# Patient Record
Sex: Female | Born: 1970 | Race: Black or African American | Hispanic: No | Marital: Married | Smoking: Never smoker
Health system: Southern US, Community
[De-identification: ages and names within clinical notes are randomized; demographics above are authoritative.]

## PROBLEM LIST (undated history)

## (undated) DIAGNOSIS — K6282 Dysplasia of anus: Secondary | ICD-10-CM

## (undated) DIAGNOSIS — IMO0002 Reserved for concepts with insufficient information to code with codable children: Secondary | ICD-10-CM

## (undated) DIAGNOSIS — O1490 Unspecified pre-eclampsia, unspecified trimester: Secondary | ICD-10-CM

## (undated) DIAGNOSIS — Z87442 Personal history of urinary calculi: Secondary | ICD-10-CM

## (undated) DIAGNOSIS — I10 Essential (primary) hypertension: Secondary | ICD-10-CM

## (undated) DIAGNOSIS — M0800 Unspecified juvenile rheumatoid arthritis of unspecified site: Secondary | ICD-10-CM

## (undated) DIAGNOSIS — N200 Calculus of kidney: Secondary | ICD-10-CM

## (undated) DIAGNOSIS — M329 Systemic lupus erythematosus, unspecified: Secondary | ICD-10-CM

## (undated) DIAGNOSIS — Z973 Presence of spectacles and contact lenses: Secondary | ICD-10-CM

## (undated) DIAGNOSIS — Z5189 Encounter for other specified aftercare: Secondary | ICD-10-CM

## (undated) HISTORY — PX: TUBAL LIGATION: SHX77

## (undated) HISTORY — DX: Systemic lupus erythematosus, unspecified: M32.9

## (undated) HISTORY — DX: Reserved for concepts with insufficient information to code with codable children: IMO0002

## (undated) HISTORY — DX: Unspecified juvenile rheumatoid arthritis of unspecified site: M08.00

## (undated) HISTORY — DX: Unspecified pre-eclampsia, unspecified trimester: O14.90

## (undated) HISTORY — DX: Calculus of kidney: N20.0

## (undated) HISTORY — DX: Encounter for other specified aftercare: Z51.89

---

## 2006-01-19 ENCOUNTER — Ambulatory Visit: Payer: Self-pay | Admitting: Family Medicine

## 2006-03-07 DIAGNOSIS — E669 Obesity, unspecified: Secondary | ICD-10-CM | POA: Insufficient documentation

## 2006-03-07 DIAGNOSIS — M329 Systemic lupus erythematosus, unspecified: Secondary | ICD-10-CM

## 2006-04-27 ENCOUNTER — Encounter: Payer: Self-pay | Admitting: Family Medicine

## 2006-07-26 ENCOUNTER — Encounter: Payer: Self-pay | Admitting: Family Medicine

## 2007-02-27 ENCOUNTER — Other Ambulatory Visit: Admission: RE | Admit: 2007-02-27 | Discharge: 2007-02-27 | Payer: Self-pay | Admitting: Family Medicine

## 2007-02-27 ENCOUNTER — Ambulatory Visit: Payer: Self-pay | Admitting: Family Medicine

## 2007-02-27 ENCOUNTER — Encounter: Payer: Self-pay | Admitting: Family Medicine

## 2007-02-27 DIAGNOSIS — L2089 Other atopic dermatitis: Secondary | ICD-10-CM | POA: Insufficient documentation

## 2007-03-05 ENCOUNTER — Telehealth (INDEPENDENT_AMBULATORY_CARE_PROVIDER_SITE_OTHER): Payer: Self-pay | Admitting: *Deleted

## 2007-03-07 ENCOUNTER — Ambulatory Visit: Payer: Self-pay | Admitting: Family Medicine

## 2007-03-08 ENCOUNTER — Encounter: Payer: Self-pay | Admitting: Family Medicine

## 2007-03-08 ENCOUNTER — Telehealth (INDEPENDENT_AMBULATORY_CARE_PROVIDER_SITE_OTHER): Payer: Self-pay | Admitting: *Deleted

## 2007-03-08 LAB — CONVERTED CEMR LAB
Clue Cells Wet Prep HPF POC: NONE SEEN
Trich, Wet Prep: NONE SEEN
Yeast Wet Prep HPF POC: NONE SEEN

## 2007-05-03 ENCOUNTER — Ambulatory Visit: Payer: Self-pay | Admitting: Family Medicine

## 2007-06-18 ENCOUNTER — Ambulatory Visit: Payer: Self-pay | Admitting: Family Medicine

## 2008-02-25 ENCOUNTER — Telehealth: Payer: Self-pay | Admitting: Family Medicine

## 2008-02-25 ENCOUNTER — Ambulatory Visit: Payer: Self-pay | Admitting: Family Medicine

## 2008-02-25 LAB — CONVERTED CEMR LAB: Beta hcg, urine, semiquantitative: POSITIVE

## 2008-03-12 ENCOUNTER — Encounter: Payer: Self-pay | Admitting: Family Medicine

## 2008-03-12 LAB — CONVERTED CEMR LAB
ALT: 18 units/L
Alkaline Phosphatase: 45 units/L
CO2: 27 meq/L
Creatinine, Ser: 0.7 mg/dL
Sodium: 138 meq/L
Total Bilirubin: 0.6 mg/dL
Total Protein: 6.6 g/dL

## 2008-03-14 ENCOUNTER — Encounter: Payer: Self-pay | Admitting: Family Medicine

## 2008-03-24 ENCOUNTER — Encounter: Payer: Self-pay | Admitting: Family Medicine

## 2008-05-02 ENCOUNTER — Ambulatory Visit: Payer: Self-pay | Admitting: Family Medicine

## 2008-05-30 DIAGNOSIS — Z8759 Personal history of other complications of pregnancy, childbirth and the puerperium: Secondary | ICD-10-CM

## 2008-05-30 HISTORY — PX: TUBAL LIGATION: SHX77

## 2008-05-30 HISTORY — DX: Personal history of other complications of pregnancy, childbirth and the puerperium: Z87.59

## 2008-08-05 ENCOUNTER — Encounter: Payer: Self-pay | Admitting: Family Medicine

## 2008-08-13 ENCOUNTER — Encounter: Payer: Self-pay | Admitting: Family Medicine

## 2008-09-05 ENCOUNTER — Encounter: Payer: Self-pay | Admitting: Family Medicine

## 2008-12-15 ENCOUNTER — Ambulatory Visit: Payer: Self-pay | Admitting: Family Medicine

## 2008-12-15 DIAGNOSIS — I10 Essential (primary) hypertension: Secondary | ICD-10-CM | POA: Insufficient documentation

## 2008-12-16 ENCOUNTER — Telehealth (INDEPENDENT_AMBULATORY_CARE_PROVIDER_SITE_OTHER): Payer: Self-pay | Admitting: *Deleted

## 2008-12-18 ENCOUNTER — Encounter: Payer: Self-pay | Admitting: Family Medicine

## 2009-01-06 ENCOUNTER — Telehealth: Payer: Self-pay | Admitting: Family Medicine

## 2009-01-06 DIAGNOSIS — R809 Proteinuria, unspecified: Secondary | ICD-10-CM | POA: Insufficient documentation

## 2009-01-28 ENCOUNTER — Encounter: Payer: Self-pay | Admitting: Family Medicine

## 2009-06-23 ENCOUNTER — Ambulatory Visit: Payer: Self-pay | Admitting: Family Medicine

## 2009-06-23 DIAGNOSIS — R599 Enlarged lymph nodes, unspecified: Secondary | ICD-10-CM | POA: Insufficient documentation

## 2009-06-24 ENCOUNTER — Encounter: Payer: Self-pay | Admitting: Family Medicine

## 2009-06-25 LAB — CONVERTED CEMR LAB
Basophils Relative: 1 % (ref 0–1)
Eosinophils Absolute: 0.1 10*3/uL (ref 0.0–0.7)
Eosinophils Relative: 1 % (ref 0–5)
Glucose, Bld: 83 mg/dL (ref 70–99)
HCT: 44.3 % (ref 36.0–46.0)
HDL: 41 mg/dL (ref >39)
LDL Cholesterol: 87 mg/dL (ref 0–99)
Lymphs Abs: 1.6 10*3/uL (ref 0.7–4.0)
MCHC: 32.1 g/dL (ref 30.0–36.0)
MCV: 82.2 fL (ref 78.0–100.0)
Monocytes Relative: 10 % (ref 3–12)
Neutrophils Relative %: 60 % (ref 43–77)
Platelets: 214 10*3/uL (ref 150–400)
Total CHOL/HDL Ratio: 3.4
WBC: 5.5 10*3/uL (ref 4.0–10.5)

## 2010-02-18 ENCOUNTER — Encounter: Payer: Self-pay | Admitting: Family Medicine

## 2010-03-19 ENCOUNTER — Ambulatory Visit: Payer: Self-pay | Admitting: Family Medicine

## 2010-03-22 LAB — CONVERTED CEMR LAB
Calcium: 8.9 mg/dL (ref 8.4–10.5)
Creatinine, Ser: 0.76 mg/dL (ref 0.40–1.20)
Sodium: 142 meq/L (ref 135–145)

## 2010-04-16 ENCOUNTER — Ambulatory Visit: Payer: Self-pay | Admitting: Family Medicine

## 2010-04-16 DIAGNOSIS — J069 Acute upper respiratory infection, unspecified: Secondary | ICD-10-CM | POA: Insufficient documentation

## 2010-06-29 NOTE — Assessment & Plan Note (Signed)
Summary: f/u BP   Vital Signs:  Patient profile:   40 year old female Menstrual status:  regular Height:      64 inches Weight:      178 pounds Pulse rate:   80 / minute BP sitting:   135 / 84  (left arm) Cuff size:   regular  Vitals Entered By: Kathlene November LPN (March 19, 2010 9:01 AM) CC: follow-up BP, Hypertension Management   CC:  follow-up BP and Hypertension Management.  Hypertension History:      She denies headache, chest pain, palpitations, dyspnea with exertion, orthopnea, peripheral edema, visual symptoms, and side effects from treatment.  She notes no problems with any antihypertensive medication side effects.  Some BPs are running higher, even on Enalapril 10s.  She had NO PROTEINURIA on last check with rheumatologist.        Positive major cardiovascular risk factors include hypertension.  Negative major cardiovascular risk factors include female age less than 31 years old, no history of diabetes or hyperlipidemia, negative family history for ischemic heart disease, and non-tobacco-user status.        Further assessment for target organ damage reveals no history of ASHD, cardiac end-organ damage (CHF/LVH), stroke/TIA, peripheral vascular disease, renal insufficiency, or hypertensive retinopathy.     Current Medications (verified): 1)  Aspirin 81 Mg Tbec (Aspirin) .... Take 1 Tablet By Mouth Once A Day 2)  Oystercal 500 + D 500-125 Mg-Unit Tabs (Calcium Carbonate-Vitamin D) .... Two Times A Day By Mouth 3)  Hydroxychloroquine Sulfate 200 Mg Tabs (Hydroxychloroquine Sulfate) .... Two Times A Day By Mouth 4)  Multivitamins  Tabs (Multiple Vitamin) .... Take 1 Tablet By Mouth Once A Day 5)  Prednisone 5 Mg Tabs (Prednisone) .... Take 1 Tablet By Mouth Every Other Day 6)  Enalapril Maleate 10 Mg Tabs (Enalapril Maleate) .Marland Kitchen.. 1 Tab By Mouth Daily  Allergies (verified): 1)  ! Sulfa  Comments:  Nurse/Medical Assistant: The patient's medications and allergies were  reviewed with the patient and were updated in the Medication and Allergy Lists. Kathlene November LPN (March 19, 2010 9:01 AM)  Social History: Reviewed history from 06/23/2009 and no changes required. Married to [place] an adopted son and an 66 month old son.  Planning to adopt her 1 yr old neice..    Had 1 abortion.  Works as a Charity fundraiser.  Clinical research associate.  Denies drugs or ETOH.  Does aerobics 3 x a wk, trying to lose wt.  Review of Systems      See HPI  Physical Exam  General:  alert, well-developed, well-nourished, and well-hydrated.  obese Head:  normocephalic and atraumatic.   Eyes:  pupils equal, pupils round, and pupils reactive to light.   Mouth:  pharynx pink and moist.   Neck:  no masses.   Lungs:  Normal respiratory effort, chest expands symmetrically. Lungs are clear to auscultation, no crackles or wheezes. Heart:  Normal rate and regular rhythm. S1 and S2 normal without gallop, murmur, click, rub or other extra sounds. Pulses:  2+ radial pulses Extremities:  no E/C/C Skin:  color normal.   Cervical Nodes:  No lymphadenopathy noted Psych:  good eye contact, not anxious appearing, and not depressed appearing.     Impression & Recommendations:  Problem # 1:  ESSENTIAL HYPERTENSION, BENIGN (ICD-401.1) BP OK today but has had several high readings at home.  I will increase her dose from 10 to 20 mg once daily today.  Update a BMP today.  Will send  a copy to rheum and nephrology. Her updated medication list for this problem includes:    Enalapril Maleate 20 Mg Tabs (Enalapril maleate) .Marland Kitchen... 1 tab by mouth daily  Orders: T-Basic Metabolic Panel [id number])  BP today: 135/84 Prior BP: 132/90 (06/23/2009)  10 Yr Risk Heart Disease: 1 %  Labs Reviewed: K+: 4.1 (03/12/2008) Creat: : 0.7 (03/12/2008)   Chol: 141 (06/24/2009)   HDL: 41 (06/24/2009)   LDL: 87 (06/24/2009)   TG: 63 (06/24/2009)  Problem # 2:  PROTEINURIA (ICD-791.0) Her last UA was neg for protein at her  nephrology office with new start of Enalapril.  This is progess. She is due for f/u with Dr Westley Hummer in Nov.  Reminded her to schedule this.  SLE meds have not changed.  Problem # 3:  SLE (ICD-710.0) Per rheum. Her updated medication list for this problem includes:    Aspirin 81 Mg Tbec (Aspirin) .Marland Kitchen... Take 1 tablet by mouth once a day    Hydroxychloroquine Sulfate 200 Mg Tabs (Hydroxychloroquine sulfate) .Marland Kitchen..Marland Kitchen Two times a day by mouth    Prednisone 5 Mg Tabs (Prednisone) .Marland Kitchen... Take 1 tablet by mouth every other day  Complete Medication List: 1)  Aspirin 81 Mg Tbec (Aspirin) .... Take 1 tablet by mouth once a day 2)  Oystercal 500 + D 500-125 Mg-unit Tabs (Calcium carbonate-vitamin d) .... Two times a day by mouth 3)  Hydroxychloroquine Sulfate 200 Mg Tabs (Hydroxychloroquine sulfate) .... Two times a day by mouth 4)  Multivitamins Tabs (Multiple vitamin) .... Take 1 tablet by mouth once a day 5)  Prednisone 5 Mg Tabs (Prednisone) .... Take 1 tablet by mouth every other day 6)  Enalapril Maleate 20 Mg Tabs (Enalapril maleate) .Marland Kitchen.. 1 tab by mouth daily  Hypertension Assessment/Plan:      The patient's hypertensive risk group is category A: No risk factors and no target organ damage.  Her calculated 10 year risk of coronary heart disease is 1 %.  Today's blood pressure is 135/84.  Her blood pressure goal is < 140/90.  Patient Instructions: 1)  Go up on Enalapril to 20 mg once daily for BP. 2)  Update BMP downstairs today. 3)  I will send a copy to rheumatologist and Dr Westley Hummer. 4)  Schedule f/u with Dr Westley Hummer in [facility]. 5)  Will call you w/ lab results Monday. 6)  Schedule f/u in 4 mos. Prescriptions: ENALAPRIL MALEATE 20 MG TABS (ENALAPRIL MALEATE) 1 tab by mouth daily  #90 x 1   Entered and Authorized by:   Seymour Bars DO   Signed by:   Seymour Bars DO on 03/19/2010   Method used:   Electronically to        [facility] [phone]* (retail)       [address] [place], [place]  [address]       Ph: [phone]       Fax: [phone]   RxID:   [id number]    Orders Added: 1)  T-Basic Metabolic Panel [id number] 2)  Est. Patient Level III [54627]

## 2010-06-29 NOTE — Letter (Signed)
Summary: [facility]   [facility]   Imported By: Lanelle Bal 03/01/2010 10:38:06  _____________________________________________________________________  External Attachment:    Type:   Image     Comment:   External Document

## 2010-06-29 NOTE — Assessment & Plan Note (Signed)
Summary: CPE w/o pap   Vital Signs:  Patient profile:   40 year old female Menstrual status:  regular LMP:     06/01/2009 Height:      64 inches Weight:      184 pounds BMI:     31.70 O2 Sat:      99 % on Room air Temp:     98.6 degrees F oral Pulse rate:   77 / minute BP sitting:   132 / 90  (left arm) Cuff size:   large  Vitals Entered By: Payton Spark CMA (June 23, 2009 1:46 PM)  O2 Flow:  Room air CC: CPE w/ pap LMP (date): 06/01/2009     Menstrual Status regular Enter LMP: 06/01/2009   Primary Care Provider:  Seymour Bars DO  CC:  CPE w/ pap.  History of Present Illness: 40 yo AAF presents for CPE with pap smear.  She is s/p BTL  She has an adopted 70 yr old son and an 63 month old son born via NSVD  at [place].  She went back for her postpartum check so her pap smear is UTD.    She had a high risk pregancy but her SLE did not flare up.  She was on bedrest for pre-clampsia.  She delivered at 37 wks.    She is due for cholesterol testing.  Seeing Dr Marcha Solders in [place] for SLE and Dr Westley Hummer in [facility] for nephrology.    Allergies: 1)  ! Sulfa  Past History:  Past Medical History: Reviewed history from 12/15/2008 and no changes required. 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 father healthy  mother CVA x 2, first one in 7`s; HTN  only child  Paternal Aunt SLE  Social History: Reviewed history from 12/15/2008 and no changes required. Married to [place] an adopted son and an 43 month old son.  Planning to adopt her 1 yr old neice..    Had 1 abortion.  Works as a Charity fundraiser.  Clinical research associate.  Denies drugs or ETOH.  Does aerobics 3 x a wk, trying to lose wt.  Review of Systems  The patient denies anorexia, fever, weight loss, weight gain, vision loss, decreased hearing,  hoarseness, chest pain, syncope, dyspnea on exertion, peripheral edema, prolonged cough, headaches, hemoptysis, abdominal pain, melena, hematochezia, severe indigestion/heartburn, hematuria, incontinence, genital sores, muscle weakness, suspicious skin lesions, transient blindness, difficulty walking, depression, unusual weight change, abnormal bleeding, enlarged lymph nodes, angioedema, breast masses, and testicular masses.    Physical Exam  General:  alert, well-developed, well-nourished, well-hydrated, and overweight-appearing.   Head:  normocephalic and atraumatic.   Eyes:  pupils equal, pupils round, and pupils reactive to light.   Ears:  no external deformities.   Nose:  no nasal discharge.   Mouth:  good dentition and pharynx pink and moist.   Neck:  hard pea - sized R posterior cervical chain lymph node, non tender Breasts:  No mass, nodules, thickening, tenderness, bulging, retraction, inflamation, nipple discharge or skin changes noted.   Lungs:  Normal respiratory effort, chest expands symmetrically. Lungs are clear to auscultation, no crackles or wheezes. Heart:  Normal rate and regular rhythm. S1 and S2 normal without gallop, murmur, click, rub or other extra sounds. Abdomen:  Bowel sounds positive,abdomen soft and non-tender without masses, organomegaly or hernias  noted. Msk:  no joint swelling, no joint warmth, and no redness over joints.   Pulses:  2+ radial and pedal pulses Extremities:  no E/C/C Skin:  color normal.   Cervical Nodes:  No lymphadenopathy noted Psych:  good eye contact, not anxious appearing, and not depressed appearing.     Impression & Recommendations:  Problem # 1:  Preventive Health Care (ICD-V70.0) Keeping healthy checklist for women reviewed. BP at the [facility] -- go up on Enalapril from 5--> 10 mg once daily. Has f/u with Dr Iran Ouch for [facility] and f/u SLE. Update fasting cholesterol, glucose  and CBC. Tetanus UTD. Postpartum pap done 7 mos ago. Work on  [employer], exercise. DEXA done 2010 for chronic steroid use.    Problem # 2:  CERVICAL LYMPHADENOPATHY, ANTERIOR, RIGHT (ICD-785.6) Treat with Keflex x 7 days and check a CBC today. REcheck node in 2-3 wks. If persisting, will send to gen surg for a biopsy (it has been there 1-2 mos already). The following medications were removed from the medication list:    Amoxicillin 875 Mg Tabs (Amoxicillin) .Marland Kitchen... 1 tab by mouth q 12 hrs x 10 days Her updated medication list for this problem includes:    Keflex 500 Mg Caps (Cephalexin) .Marland Kitchen... 1 capsule by mouth three times a day x 7 days  Orders: T-CBC w/Diff ([id number])  Complete Medication List: 1)  Aspirin 81 Mg Tbec (Aspirin) .... Take 1 tablet by mouth once a day 2)  Oystercal 500 + D 500-125 Mg-unit Tabs (Calcium carbonate-vitamin d) .... Two times a day by mouth 3)  Hydroxychloroquine Sulfate 200 Mg Tabs (Hydroxychloroquine sulfate) .... Two times a day by mouth 4)  Multivitamins Tabs (Multiple vitamin) .... Take 1 tablet by mouth once a day 5)  Prednisone 5 Mg Tabs (Prednisone) .... Take 1 tablet by mouth every other day 6)  Enalapril Maleate 10 Mg Tabs (Enalapril maleate) .Marland Kitchen.. 1 tab by mouth daily 7)  Keflex 500 Mg Caps (Cephalexin) .Marland Kitchen.. 1 capsule by mouth three times a day x 7 days  Other Orders: T-Lipid Profile ([id number]) T-Glucose, Blood ([id number])  Patient Instructions: 1)  Have fasting labs drawn. 2)  Take 7 days of Keflex. 3)  REcheck R sided neck lymph node in 2-3 wks. Prescriptions: KEFLEX 500 MG CAPS (CEPHALEXIN) 1 capsule by mouth three times a day x 7 days  #21 x 0   Entered and Authorized by:   Seymour Bars DO   Signed by:   Seymour Bars DO on 06/23/2009   Method used:   Electronically to        [facility] [phone]* (retail)       [address] [place], [place]  [address]       Ph: [phone]       Fax: [phone]   RxID:   [id number] ENALAPRIL MALEATE 10 MG TABS (ENALAPRIL MALEATE) 1 tab by  mouth daily  #90 x 1   Entered and Authorized by:   Seymour Bars DO   Signed by:   Seymour Bars DO on 06/23/2009   Method used:   Electronically to        [facility] [phone]* (retail)       [address] [place], [place]  [address]       Ph: [phone]       Fax: [phone]   RxID:   [id number]

## 2010-06-29 NOTE — Assessment & Plan Note (Signed)
Summary: URI,HTN   Vital Signs:  Patient profile:   40 year old female Menstrual status:  regular Height:      64 inches Weight:      178 pounds Temp:     98.2 degrees F oral Pulse rate:   90 / minute BP sitting:   149 / 88  (right arm) Cuff size:   regular  Vitals Entered By: Avon Gully CMA, Duncan Dull) (April 16, 2010 9:15 AM) CC: sinus pressure since Mon, teeth hurt last pm   Primary Care Provider:  Seymour Bars DO  CC:  sinus pressure since Mon and teeth hurt last pm.  History of Present Illness: sinus pressure since Mon, teeth hurt last pm.  Started about 5 days.  Started wiht a ST, runny nose, cough.  + sick contacts with kids at home.  Sinus pressure started yesterday.  No fever.  Drinking tea and homey.  Cough drops. NO GI sxs.  no ear pain.    Current Medications (verified): 1)  Aspirin 81 Mg Tbec (Aspirin) .... Take 1 Tablet By Mouth Once A Day 2)  Oystercal 500 + D 500-125 Mg-Unit Tabs (Calcium Carbonate-Vitamin D) .... Two Times A Day By Mouth 3)  Hydroxychloroquine Sulfate 200 Mg Tabs (Hydroxychloroquine Sulfate) .... Two Times A Day By Mouth 4)  Multivitamins  Tabs (Multiple Vitamin) .... Take 1 Tablet By Mouth Once A Day 5)  Prednisone 5 Mg Tabs (Prednisone) .... Take 1 Tablet By Mouth Every Other Day 6)  Enalapril Maleate 20 Mg Tabs (Enalapril Maleate) .Marland Kitchen.. 1 Tab By Mouth Daily  Allergies (verified): 1)  ! Sulfa  Comments:  Nurse/Medical Assistant: The patient's medications and allergies were reviewed with the patient and were updated in the Medication and Allergy Lists. Avon Gully CMA, Duncan Dull) (April 16, 2010 9:17 AM)  Physical Exam  General:  Well-developed,well-nourished,in no acute distress; alert,appropriate and cooperative throughout examination Head:  Normocephalic and atraumatic without obvious abnormalities. No apparent alopecia or balding. No facial pressure or tenderness.  Eyes:  No corneal or conjunctival inflammation noted. EOMI.  Perrla. Ears:  External ear exam shows no significant lesions or deformities.  Otoscopic examination reveals clear canals, tympanic membranes are intact bilaterally without bulging, retraction, inflammation or discharge. Hearing is grossly normal bilaterally. Nose:  External nasal examination shows no deformity or inflammation.  Mouth:  Oral mucosa and oropharynx without lesions or exudates.  Teeth in good repair. Neck:  No deformities, masses, or tenderness noted. Lungs:  Normal respiratory effort, chest expands symmetrically. Lungs are clear to auscultation, no crackles or wheezes. Heart:  Normal rate and regular rhythm. S1 and S2 normal without gallop, murmur, click, rub or other extra sounds. Pulses:  Radial 2+  Skin:  no rashes.   Cervical Nodes:  No lymphadenopathy noted Psych:  Cognition and judgment appear intact. Alert and cooperative with normal attention span and concentration. No apparent delusions, illusions, hallucinations   Impression & Recommendations:  Problem # 1:  URI (ICD-465.9) She is on steroids for SLE so dicussed that if she suddenly feels worse or not getting better or suddently spikes a temp needs to call us.  Her updated medication list for this problem includes:    Aspirin 81 Mg Tbec (Aspirin) .Marland Kitchen... Take 1 tablet by mouth once a day  Instructed on symptomatic treatment. Call if symptoms persist or worsen.   Problem # 2:  ESSENTIAL HYPERTENSION, BENIGN (ICD-401.1) Not well controlled. Change to Losartan and f/u withDr. B in one month.  Her updated medication  list for this problem includes:    Losartan Potassium 100 Mg Tabs (Losartan potassium) .Marland Kitchen... Take 1 tablet by mouth once a day  Complete Medication List: 1)  Aspirin 81 Mg Tbec (Aspirin) .... Take 1 tablet by mouth once a day 2)  Oystercal 500 + D 500-125 Mg-unit Tabs (Calcium carbonate-vitamin d) .... Two times a day by mouth 3)  Hydroxychloroquine Sulfate 200 Mg Tabs (Hydroxychloroquine sulfate) .... Two  times a day by mouth 4)  Multivitamins Tabs (Multiple vitamin) .... Take 1 tablet by mouth once a day 5)  Prednisone 5 Mg Tabs (Prednisone) .... Take 1 tablet by mouth every other day 6)  Losartan Potassium 100 Mg Tabs (Losartan potassium) .... Take 1 tablet by mouth once a day  Patient Instructions: 1)  Call if you get worse, or if not better in one week.  Prescriptions: LOSARTAN POTASSIUM 100 MG TABS (LOSARTAN POTASSIUM) Take 1 tablet by mouth once a day  #30 x 1   Entered and Authorized by:   Nani Gasser MD   Signed by:   Nani Gasser MD on 04/16/2010   Method used:   Electronically to        [facility] [phone]* (retail)       [address] [place], [place]  [address]       Ph: [phone]       Fax: [phone]   RxID:   [id number]    Orders Added: 1)  Est. Patient Level III [25956]

## 2010-07-20 ENCOUNTER — Encounter: Payer: Self-pay | Admitting: Family Medicine

## 2010-07-20 ENCOUNTER — Encounter (INDEPENDENT_AMBULATORY_CARE_PROVIDER_SITE_OTHER): Payer: Managed Care, Other (non HMO) | Admitting: Family Medicine

## 2010-07-20 ENCOUNTER — Other Ambulatory Visit (HOSPITAL_COMMUNITY)
Admission: RE | Admit: 2010-07-20 | Discharge: 2010-07-20 | Disposition: A | Discharge status: Home / Self Care | Payer: Managed Care, Other (non HMO) | Source: Ambulatory Visit | Attending: Family Medicine | Admitting: Family Medicine

## 2010-07-20 ENCOUNTER — Other Ambulatory Visit: Payer: Self-pay | Admitting: Family Medicine

## 2010-07-20 DIAGNOSIS — Z01419 Encounter for gynecological examination (general) (routine) without abnormal findings: Secondary | ICD-10-CM

## 2010-07-20 DIAGNOSIS — Z23 Encounter for immunization: Secondary | ICD-10-CM

## 2010-07-22 ENCOUNTER — Encounter: Payer: Self-pay | Admitting: Family Medicine

## 2010-07-23 ENCOUNTER — Encounter: Payer: Self-pay | Admitting: Family Medicine

## 2010-07-27 LAB — CONVERTED CEMR LAB
Albumin: 3.6 g/dL (ref 3.5–5.2)
Alkaline Phosphatase: 64 units/L (ref 39–117)
BUN: 11 mg/dL (ref 6–23)
CO2: 23 meq/L (ref 19–32)
Cholesterol: 141 mg/dL (ref 0–200)
Glucose, Bld: 84 mg/dL (ref 70–99)
HDL: 40 mg/dL (ref >39)
Hemoglobin: 13.7 g/dL (ref 12.0–15.0)
MCHC: 33.1 g/dL (ref 30.0–36.0)
MCV: 82.1 fL (ref 78.0–100.0)
Potassium: 4.1 meq/L (ref 3.5–5.3)
RBC: 5.04 M/uL (ref 3.87–5.11)
Sodium: 136 meq/L (ref 135–145)
Total Bilirubin: 0.5 mg/dL (ref 0.3–1.2)
Total Protein: 7 g/dL (ref 6.0–8.3)
Triglycerides: 79 mg/dL (ref <150)
WBC: 8.2 10*3/uL (ref 4.0–10.5)

## 2010-07-27 NOTE — Assessment & Plan Note (Signed)
Summary: Cpe W/ PAP   Vital Signs:  Patient profile:   40 year old female Menstrual status:  regular LMP:     06/17/2010 Height:      64 inches Weight:      178 pounds BMI:     30.66 O2 Sat:      98 % on Room air Pulse rate:   85 / minute BP sitting:   138 / 91  (left arm) Cuff size:   large  Vitals Entered By: Payton Spark CMA (July 20, 2010 9:11 AM)  O2 Flow:  Room air CC: CPE w/ pap LMP (date): 06/17/2010     Enter LMP: 06/17/2010   Primary Care Provider:  Seymour Bars DO  CC:  CPE w/ pap.  History of Present Illness: 40 yo AAF presents for CPE with pap smear.  She has 3 kids, 2 are adopted.  She is married, monogamous and had a BTL for contraception.  Periods are regular.  SLE stable on current meds, follows with Dr Marcha Solders.  She is due for Tdap.  Seeing eye doctor annually, on Plaquenil and has had a DEXA recently for hx of chronic prednisone use.    Denies fam hx of breast or colon cancer or of premature heart dz.      Current Medications (verified): 1)  Aspirin 81 Mg Tbec (Aspirin) .... Take 1 Tablet By Mouth Once A Day 2)  Oystercal 500 + D 500-125 Mg-Unit Tabs (Calcium Carbonate-Vitamin D) .... Two Times A Day By Mouth 3)  Hydroxychloroquine Sulfate 200 Mg Tabs (Hydroxychloroquine Sulfate) .... Two Times A Day By Mouth 4)  Multivitamins  Tabs (Multiple Vitamin) .... Take 1 Tablet By Mouth Once A Day 5)  Prednisone 5 Mg Tabs (Prednisone) .... Take 1 Tablet By Mouth Every Other Day 6)  Losartan Potassium 100 Mg Tabs (Losartan Potassium) .... Take 1 Tablet By Mouth Once A Day  Allergies (verified): 1)  ! Sulfa  Past History:  Past Medical History: Reviewed history from 12/15/2008 and no changes required. chronic prednisone use  fasting glucose 68 8-07  hs JRA since age 54 SLE-- Dr Marcha Solders in [facility] G2P1- preeclampsia, delivery at 37 wks  Past Surgical History: BTL  Family History: Reviewed history from 04/27/2006 and no changes  required. father healthy  mother CVA x 2, first one in 56`s; HTN  only child  Paternal Aunt SLE  Social History: Reviewed history from 06/23/2009 and no changes required. Married to Sigurd an adopted son and an 66 month old son.  Planning to adopt her 1 yr old neice..    Had 1 abortion.  Works as a Charity fundraiser.  Clinical research associate.  Denies drugs or ETOH.  Does aerobics 3 x a wk, trying to lose wt.  Review of Systems  The patient denies anorexia, fever, weight loss, weight gain, vision loss, decreased hearing, hoarseness, chest pain, syncope, dyspnea on exertion, peripheral edema, prolonged cough, headaches, hemoptysis, abdominal pain, melena, hematochezia, severe indigestion/heartburn, hematuria, incontinence, genital sores, muscle weakness, suspicious skin lesions, transient blindness, difficulty walking, depression, unusual weight change, abnormal bleeding, enlarged lymph nodes, angioedema, breast masses, and testicular masses.    Physical Exam  General:  alert, well-developed, well-nourished, well-hydrated, and overweight-appearing.   Head:  normocephalic and atraumatic.   Eyes:  conjunctiva clear Ears:  EACs patent; TMs translucent and gray with good cone of light and bony landmarks.  Nose:  no nasal discharge.   Mouth:  good dentition and pharynx pink and moist.  Neck:  no masses.   Breasts:  No mass, nodules, thickening, tenderness, bulging, retraction, inflamation, nipple discharge or skin changes noted.   Lungs:  Normal respiratory effort, chest expands symmetrically. Lungs are clear to auscultation, no crackles or wheezes. Heart:  Normal rate and regular rhythm. S1 and S2 normal without gallop, murmur, click, rub or other extra sounds. Abdomen:  Bowel sounds positive,abdomen soft and non-tender without masses, organomegaly Genitalia:  Pelvic Exam:        External: normal female genitalia without lesions or masses        Vagina: normal without lesions or masses, white discharge with  cervical redness        Cervix: normal without lesions or masses        Adnexa: normal bimanual exam without masses or fullness        Uterus: normal by palpation        Pap smear: performed Msk:  no joint swelling, no joint warmth, and no redness over joints.   Pulses:  2+ radial and pedal pulses Extremities:  no LE edema Skin:  color normal and no suspicious lesions.   Cervical Nodes:  No lymphadenopathy noted Psych:  good eye contact, not anxious appearing, and not depressed appearing.     Impression & Recommendations:  Problem # 1:  Gynecological examination-routine (ICD-V72.31) Keeping healthy checklist for women reviewed. BP high -- will add Norvasc to Losartan for improved control and recheck in 4 mos. Update fastting labs. Thin prep pap smear done today; repeat in 2 yrs if normal. BMI 30.6 c/w class I obesity.  Counseled on healthy diet , regular exercise, wt loss. Tdap updated today. BTL for birth control.  Complete Medication List: 1)  Aspirin 81 Mg Tbec (Aspirin) .... Take 1 tablet by mouth once a day 2)  Oystercal 500 + D 500-125 Mg-unit Tabs (Calcium carbonate-vitamin d) .... Two times a day by mouth 3)  Hydroxychloroquine Sulfate 200 Mg Tabs (Hydroxychloroquine sulfate) .... Two times a day by mouth 4)  Multivitamins Tabs (Multiple vitamin) .... Take 1 tablet by mouth once a day 5)  Prednisone 5 Mg Tabs (Prednisone) .... Take 1 tablet by mouth every other day 6)  Losartan Potassium 100 Mg Tabs (Losartan potassium) .... Take 1 tablet by mouth once a day 7)  Norvasc 5 Mg Tabs (Amlodipine besylate) .Marland Kitchen.. 1 tab by mouth once daily  Other Orders: Tdap => 77yrs IM (16109) Admin 1st Vaccine (60454) T-CBC No Diff ([id number]) T-Comprehensive Metabolic Panel [phone]) T-Lipid Profile ([id number])  Patient Instructions: 1)  Will call you with pap results Fri or Monday. 2)  Stay on Losartan daily. 3)  Add Norvasc 5 mg once daily for high BP. 4)  Update fasting  labs. 5)  Will call you w/ results. 6)  Return for f/u BP in 4 mos. Prescriptions: LOSARTAN POTASSIUM 100 MG TABS (LOSARTAN POTASSIUM) Take 1 tablet by mouth once a day  #30 x 5   Entered and Authorized by:   Seymour Bars DO   Signed by:   Seymour Bars DO on 07/20/2010   Method used:   Electronically to        [facility] [phone]* (retail)       [address] [place], [place]  [address]       Ph: [phone]       Fax: [phone]   RxID:   [id number] NORVASC 5 MG TABS (AMLODIPINE BESYLATE) 1 tab by  mouth once daily  #30 x 5   Entered and Authorized by:   Seymour Bars DO   Signed by:   Seymour Bars DO on 07/20/2010   Method used:   Electronically to        [facility] [phone]* (retail)       [address] [place], [place]  [address]       Ph: [phone]       Fax: [phone]   RxID:   [id number]    Orders Added: 1)  Tdap => 18yrs IM [90715] 2)  Admin 1st Vaccine [90471] 3)  T-CBC No Diff [[id number]] 4)  T-Comprehensive Metabolic Panel [[id number]] 5)  T-Lipid Profile [[id number]] 6)  Est. Patient age 32-39 [21]   Immunizations Administered:  Tetanus Vaccine:    Vaccine Type: Tdap    Site: left deltoid    Dose: 0.5 ml    Route: IM    Given by: Payton Spark CMA    Exp. Date: 03/18/2012    Lot #: [id number]    VIS given: 04/16/08 version given July 20, 2010.   Immunizations Administered:  Tetanus Vaccine:    Vaccine Type: Tdap    Site: left deltoid    Dose: 0.5 ml    Route: IM    Given by: Payton Spark CMA    Exp. Date: 03/18/2012    Lot #: [id number]    VIS given: 04/16/08 version given July 20, 2010.

## 2010-11-07 ENCOUNTER — Encounter: Payer: Self-pay | Admitting: Family Medicine

## 2010-11-12 ENCOUNTER — Encounter: Payer: Self-pay | Admitting: Family Medicine

## 2010-11-12 ENCOUNTER — Ambulatory Visit (INDEPENDENT_AMBULATORY_CARE_PROVIDER_SITE_OTHER): Payer: Managed Care, Other (non HMO) | Admitting: Family Medicine

## 2010-11-12 DIAGNOSIS — I10 Essential (primary) hypertension: Secondary | ICD-10-CM

## 2010-11-12 DIAGNOSIS — M329 Systemic lupus erythematosus, unspecified: Secondary | ICD-10-CM

## 2010-11-12 NOTE — Patient Instructions (Signed)
Stay on current meds.  Monitor resting BPs at home.  Work on [employer], regular exercise.  REturn for f/u in 6 mos.

## 2010-11-12 NOTE — Assessment & Plan Note (Signed)
BP elevated today but has been at goal elsewhere.  Did not change her meds today.  She has RFs and her labs were done in Feb of this year.  She had her urine checked with Dr Marcha Solders and apparently the ARB has helped to clear her proteinuria.  Advised purchase of home BP meter, info given on this w/ a log book.  Call if any problems.  Work on diet, exercise, wt loss.  F/u in 6 mos.

## 2010-11-12 NOTE — Progress Notes (Signed)
  Subjective:    Patient ID: Anna Patel, female    DOB: 15-Jun-1970, 40 y.o.   MRN: [id number]  HPI  40 yo AAF presents for f/u HTN.  She is doing well.  She is on Norvasc + Losartan.  Her BPs outside of here have been running 130s/ 70s and she denies edema, chest pain, SOB, heart palpitations or SEs from the meds.  She is s/p BTL.  Working on wt loss.  No changes in lupus meds, followed by Dr Marcha Solders in [place].  BP 139/96  Pulse 86  Ht 5\' 4"  (1.626 m)  Wt 182 lb (82.555 kg)  BMI 31.24 kg/m2  SpO2 99%  LMP 10/15/2010    Review of Systems  Constitutional: Positive for fatigue. Negative for fever and unexpected weight change.  Eyes: Negative for visual disturbance.  Respiratory: Negative for shortness of breath.   Cardiovascular: Negative for chest pain, palpitations and leg swelling.  Musculoskeletal: Negative for joint swelling and arthralgias.  Neurological: Negative for headaches.  Psychiatric/Behavioral: Negative for dysphoric mood.       Objective:   Physical Exam  Constitutional: She appears well-developed and well-nourished. No distress.       obese  Eyes: Pupils are equal, round, and reactive to light. No scleral icterus.  Neck: Neck supple.  Cardiovascular: Normal rate, regular rhythm and normal heart sounds.   No murmur heard. Pulmonary/Chest: Effort normal and breath sounds normal. No respiratory distress. She has no wheezes.  Musculoskeletal: She exhibits no edema.  Lymphadenopathy:    She has no cervical adenopathy.  Skin: Skin is warm and dry.  Psychiatric: She has a normal mood and affect.          Assessment & Plan:

## 2010-11-19 ENCOUNTER — Telehealth: Payer: Self-pay | Admitting: Family Medicine

## 2010-11-19 NOTE — Telephone Encounter (Signed)
Pt called and on vacation and apparently had a reaction to Neutrogena sunblock.  Affected arms and legs.  Pt has been using topical cream but would like a recommendation of what is best to use.  Please advise. Plan:  Routed to Dr Linford Arnold since Dr. Cathey Endow out of the office. Isidore Moos

## 2010-11-19 NOTE — Telephone Encounter (Signed)
Recommend topical hydrocortisone cream bid and benadryl 25mg  every 6 hours prn for itching and rash.

## 2010-11-23 NOTE — Telephone Encounter (Signed)
Pt informed to use hydrocortisone cream BID and benadryle 25 mg Po every 6 hours prn itching. Jarvis Newcomer, LPN Domingo Dimes

## 2011-05-12 ENCOUNTER — Encounter: Payer: Self-pay | Admitting: Emergency Medicine

## 2011-05-12 ENCOUNTER — Emergency Department
Admission: EM | Admit: 2011-05-12 | Discharge: 2011-05-12 | Disposition: A | Discharge status: Home / Self Care | Payer: Managed Care, Other (non HMO) | Source: Home / Self Care | Attending: Family Medicine | Admitting: Family Medicine

## 2011-05-12 DIAGNOSIS — J209 Acute bronchitis, unspecified: Secondary | ICD-10-CM

## 2011-05-12 MED ORDER — CLARITHROMYCIN 500 MG PO TABS: 500.0000 mg | ORAL_TABLET | Freq: Two times a day (BID) | ORAL | Status: AC

## 2011-05-12 MED ORDER — BENZONATATE 200 MG PO CAPS: 200.0000 mg | ORAL_CAPSULE | Freq: Every day | ORAL | Status: AC

## 2011-05-12 NOTE — ED Provider Notes (Signed)
History     CSN: [id number] Arrival date & time: 05/12/2011  3:53 PM   First MD Initiated Contact with Patient 05/12/11 1600      Chief Complaint  Patient presents with  . Cough      HPI Comments: Patient complains of approximately two month history of persistent cough.  It started as a mild cold  with gradually progressive URI symptoms beginning with a mild sore throat (now improved), followed by progressive nasal congestion and then a cough.  The sinus congestion resolved after two weeks but she now has persistent cough which is worse at night and generally non-productive during the day.  There has been no pleuritic pain, shortness of breath, or wheezes.  She often coughs until she gags.  She believes that she had a tetanus shot 4 years ago.  No recent fevers, chills, and sweats   Patient is a 40 y.o. female presenting with cough. The history is provided by the patient.  Cough    Past Medical History  Diagnosis Date  . Chronic steroid use   . JRA (juvenile rheumatoid arthritis)   . SLE ([place] encephalitis)   . Preeclampsia     delivery 37 weeks    Past Surgical History  Procedure Date  . Btl     Family History  Problem Relation Age of Onset  . Heart disease Mother     CVA X 2 , 40's  . Hypertension Mother   . Lupus Paternal Aunt     History  Substance Use Topics  . Smoking status: Never Smoker   . Smokeless tobacco: Not on file  . Alcohol Use: No    OB History    Grav Para Term Preterm Abortions TAB SAB Ect Mult Living                  Review of Systems  Respiratory: Positive for cough.   No sore throat. + cough No pleuritic pain No wheezing No nasal congestion No post-nasal drainage No sinus pain/pressure No itchy/red eyes No earache No hemoptysis No SOB No fever/chills No nausea No vomiting No abdominal pain No diarrhea No urinary symptoms No skin rashes No fatigue No myalgias No headache Used OTC meds without relief    Allergies  Sulfonamide derivatives  Home Medications   Current Outpatient Rx  Name Route Sig Dispense Refill  . AMLODIPINE BESYLATE 5 MG PO TABS Oral Take 5 mg by mouth daily.      . ASPIRIN EC 81 MG PO TBEC Oral Take 81 mg by mouth daily.      Marland Kitchen BENZONATATE 200 MG PO CAPS Oral Take 1 capsule (200 mg total) by mouth at bedtime. Take as needed for cough 12 capsule 0  . CALCIUM CARBONATE-VITAMIN D 500-125 MG-UNIT PO TABS Oral Take by mouth 2 (two) times daily.      Marland Kitchen CLARITHROMYCIN 500 MG PO TABS Oral Take 1 tablet (500 mg total) by mouth 2 (two) times daily. 14 tablet 0  . ERGOCALCIFEROL 50000 UNITS PO CAPS      . HYDROXYCHLOROQUINE SULFATE 200 MG PO TABS Oral Take by mouth 2 (two) times daily.      Marland Kitchen LOSARTAN POTASSIUM 100 MG PO TABS Oral Take 100 mg by mouth daily.      Marland Kitchen ONE-DAILY MULTI VITAMINS PO TABS Oral Take 1 tablet by mouth daily.      Marland Kitchen PREDNISONE 5 MG PO TABS Oral Take 5 mg by mouth every other day.  BP 133/90  Pulse 90  Temp(Src) 98.2 F (36.8 C) (Oral)  Resp 18  Ht 5\' 4"  (1.626 m)  Wt 182 lb (82.555 kg)  BMI 31.24 kg/m2  SpO2 99%  LMP 04/20/2011  Physical Exam Nursing notes and Vital Signs reviewed. Appearance:  Patient appears healthy, stated age, and in no acute distress.  Patient is obese (BMI 31.3)  Eyes:  Pupils are equal, round, and reactive to light and accomodation.  Extraocular movement is intact.  Conjunctivae are not inflamed  Ears:  Canals normal.  Tympanic membranes normal.  Nose:  Mildly congested turbinates.  No sinus tenderness.   Pharynx:  Normal Neck:  Supple.  Slightly tender shotty anterior/posterior nodes are palpated bilaterally  Lungs:  Clear to auscultation.  Breath sounds are equal.  Heart:  Regular rate and rhythm without murmurs, rubs, or gallops.  Abdomen:  Nontender without masses or hepatosplenomegaly.  Bowel sounds are present.  No CVA or flank tenderness.  Extremities:  No edema.  No calf tenderness Skin:  No rash  present.   ED Course  Procedures  none      1. Acute bronchitis       MDM  ? Pertussis Begin Biaxin and cough suppressant at bedtime. Take Mucinex  (guaifenesin) twice daily for congestion.  Increase fluid intake, rest. May use Afrin nasal spray (or generic oxymetazoline) twice daily for about 5 days.  Also recommend using saline nasal spray several times daily and/or saline nasal irrigation. Stop all antihistamines for now, and other non-prescription cough/cold preparations. Follow-up with family doctor if not improving 7 to 10 days.      Donna Christen, MD 05/12/11 [phone]

## 2011-05-12 NOTE — ED Notes (Signed)
Cough x 2 months goes between dry and productive

## 2011-05-17 ENCOUNTER — Encounter: Payer: Self-pay | Admitting: Family Medicine

## 2011-05-20 ENCOUNTER — Ambulatory Visit (INDEPENDENT_AMBULATORY_CARE_PROVIDER_SITE_OTHER): Payer: Managed Care, Other (non HMO) | Admitting: Family Medicine

## 2011-05-20 ENCOUNTER — Encounter: Payer: Self-pay | Admitting: Family Medicine

## 2011-05-20 DIAGNOSIS — J40 Bronchitis, not specified as acute or chronic: Secondary | ICD-10-CM

## 2011-05-20 NOTE — Patient Instructions (Signed)
Call  If not better in 5 days.

## 2011-05-20 NOTE — Progress Notes (Signed)
  Subjective:    Patient ID: Anna Patel, female    DOB: 1971-01-01, 40 y.o.   MRN: [id number]  HPI Cough started about 2 weeks, it was dry. A week ago went to UC.  Dx with acute bronchitis. Put on clarithromycin and tessalon perles.  She feels better.  Last ABX pill was today. About 80% better. No xray. No fever.  No ear pain or ST.  No GI sxs.  Using delsym during the day.     Review of Systems     Objective:   Physical Exam  Constitutional: She is oriented to person, place, and time. She appears well-developed and well-nourished.  HENT:  Head: Normocephalic and atraumatic.  Right Ear: External ear normal.  Left Ear: External ear normal.  Nose: Nose normal.  Mouth/Throat: Oropharynx is clear and moist.       TMs and canals are clear.   Eyes: Conjunctivae and EOM are normal. Pupils are equal, round, and reactive to light.  Neck: Neck supple. No thyromegaly present.  Cardiovascular: Normal rate, regular rhythm and normal heart sounds.   Pulmonary/Chest: Effort normal and breath sounds normal. She has no wheezes.  Lymphadenopathy:    She has no cervical adenopathy.  Neurological: She is alert and oriented to person, place, and time.  Skin: Skin is warm and dry.  Psychiatric: She has a normal mood and affect.          Assessment & Plan:  Bronchitis - She is much better. Gave reassurance.  Call if not better in 5 days and will get CXR if still feels like there is something "stuck in her chest" . Can use Mucinex when necessary. Make sure underwater with Mucinex so that will work better.

## 2011-05-30 ENCOUNTER — Telehealth: Payer: Self-pay | Admitting: *Deleted

## 2011-05-30 ENCOUNTER — Ambulatory Visit
Admission: RE | Admit: 2011-05-30 | Discharge: 2011-05-30 | Disposition: A | Discharge status: Home / Self Care | Payer: Managed Care, Other (non HMO) | Source: Ambulatory Visit | Attending: Family Medicine | Admitting: Family Medicine

## 2011-05-30 DIAGNOSIS — R05 Cough: Secondary | ICD-10-CM

## 2011-05-30 NOTE — Telephone Encounter (Signed)
Pt states she would like to have CXR done. States she is not feeling any better.  MA, LPN

## 2011-05-30 NOTE — Telephone Encounter (Signed)
Pt informed to go have cxr done. MA, LPN

## 2011-05-30 NOTE — Telephone Encounter (Signed)
Ordered cxr. She can go anytime today.

## 2011-12-16 ENCOUNTER — Ambulatory Visit (INDEPENDENT_AMBULATORY_CARE_PROVIDER_SITE_OTHER): Payer: Managed Care, Other (non HMO) | Admitting: Family Medicine

## 2011-12-16 ENCOUNTER — Encounter: Payer: Self-pay | Admitting: Family Medicine

## 2011-12-16 VITALS — BP 159/109 | HR 88 | Ht 64.0 in | Wt 185.0 lb

## 2011-12-16 DIAGNOSIS — Z0000 Encounter for general adult medical examination without abnormal findings: Secondary | ICD-10-CM

## 2011-12-16 DIAGNOSIS — M0800 Unspecified juvenile rheumatoid arthritis of unspecified site: Secondary | ICD-10-CM

## 2011-12-16 MED ORDER — AMLODIPINE BESYLATE 5 MG PO TABS: 5.0000 mg | ORAL_TABLET | Freq: Every day | ORAL | Status: DC

## 2011-12-16 MED ORDER — LOSARTAN POTASSIUM 100 MG PO TABS: 100.0000 mg | ORAL_TABLET | Freq: Every day | ORAL | Status: DC

## 2011-12-16 NOTE — Patient Instructions (Addendum)
Start a regular exercise program and make sure you are eating a healthy diet Try to eat 4 servings of dairy a day Your vaccines are up to date.  Make sure you are fasting for 8 hours before your labwork.

## 2011-12-16 NOTE — Progress Notes (Signed)
  Subjective:     Anna Patel is a 41 y.o. female and is here for a comprehensive physical exam. The patient reports problems - ran out of BP meds about a months ago. Needs refills.  Seen rhuem for her lupus. I did update her family history is her mother was recently diagnosed with DCIS.  History   Social History  . Marital Status: Married    Spouse Name: N/A    Number of Children: 3  . Years of Education: N/A   Occupational History  . [phone]    Social History Main Topics  . Smoking status: Never Smoker   . Smokeless tobacco: Not on file  . Alcohol Use: No  . Drug Use: No  . Sexually Active:      married, adopted a sons, 1 abortion, works a Charity fundraiser, does aroebics 3 X week, trying to lose weight.   Other Topics Concern  . Not on file   Social History Narrative   Started working out this weeks.     Health Maintenance  Topic Date Due  . Influenza Vaccine  02/28/2012  . Pap Smear  07/23/2013  . Tetanus/tdap  07/20/2020    The following portions of the patient's history were reviewed and updated as appropriate: allergies, current medications, past family history, past medical history, past social history, past surgical history and problem list.  Review of Systems A comprehensive review of systems was negative.   Objective:    BP 159/109  Pulse 88  Ht 5\' 4"  (1.626 m)  Wt 185 lb (83.915 kg)  BMI 31.76 kg/m2  SpO2 96% General appearance: alert, cooperative, appears stated age and moderately obese Head: Normocephalic, without obvious abnormality, atraumatic Eyes: conj clear, EOMi, PEERLA.  Ears: normal TM's and external ear canals both ears Nose: Nares normal. Septum midline. Mucosa normal. No drainage or sinus tenderness. Throat: lips, mucosa, and tongue normal; teeth and gums normal Neck: no adenopathy, no carotid bruit, no JVD, supple, symmetrical, trachea midline and thyroid not enlarged, symmetric, no tenderness/mass/nodules Back: symmetric, no curvature. ROM  normal. No CVA tenderness. Lungs: clear to auscultation bilaterally Breasts: normal appearance, no masses or tenderness Heart: regular rate and rhythm, S1, S2 normal, no murmur, click, rub or gallop Abdomen: soft, non-tender; bowel sounds normal; no masses,  no organomegaly Extremities: extremities normal, atraumatic, no cyanosis or edema Pulses: 2+ and symmetric Skin: Skin color, texture, turgor normal. No rashes or lesions Lymph nodes: Cervical, supraclavicular, and axillary nodes normal. Neurologic: Alert and oriented X 3, normal strength and tone. Normal symmetric reflexes. Normal coordination and gait    Assessment:    Healthy female exam.      Plan:     See After Visit Summary for Counseling Recommendations  Start a regular exercise program and make sure you are eating a healthy diet Try to eat 4 servings of dairy a day  Your vaccines are up to date.   Hypertension-uncontrolled. She's been out of her medication for about a month. No chest pain or shortness of breath. We will restart both of her medications and her followup in one week for a nurse blood pressure check or with me. She is due for complete panel as well as fasting lipid panel. Given lab slips they've asked today which is fasting.  I do recommend starting mammograms at age 60 for her.

## 2011-12-22 LAB — LIPID PANEL
Cholesterol: 132 mg/dL (ref 0–200)
LDL Cholesterol: 83 mg/dL (ref 0–99)
Total CHOL/HDL Ratio: 3.3 Ratio
VLDL: 9 mg/dL (ref 0–40)

## 2011-12-22 LAB — COMPLETE METABOLIC PANEL WITH GFR
ALT: 13 U/L (ref 0–35)
AST: 15 U/L (ref 0–37)
Albumin: 3.8 g/dL (ref 3.5–5.2)
Alkaline Phosphatase: 67 U/L (ref 39–117)
Calcium: 9 mg/dL (ref 8.4–10.5)
Chloride: 106 mEq/L (ref 96–112)
Potassium: 3.9 mEq/L (ref 3.5–5.3)
Sodium: 141 mEq/L (ref 135–145)
Total Protein: 7.3 g/dL (ref 6.0–8.3)

## 2011-12-22 LAB — TSH: TSH: 0.93 u[IU]/mL (ref 0.350–4.500)

## 2011-12-23 NOTE — Progress Notes (Signed)
Quick Note:  All labs are normal. ______ 

## 2012-06-08 IMAGING — CR DG CHEST 2V
2 series · 2 of 2 positions shown · non-contrast
Comparison: None.

CLINICAL DATA: Chronic persistent cough over the past month.

CHEST - 2 VIEW 05/30/2011:

[view not recorded (1 of 2)]
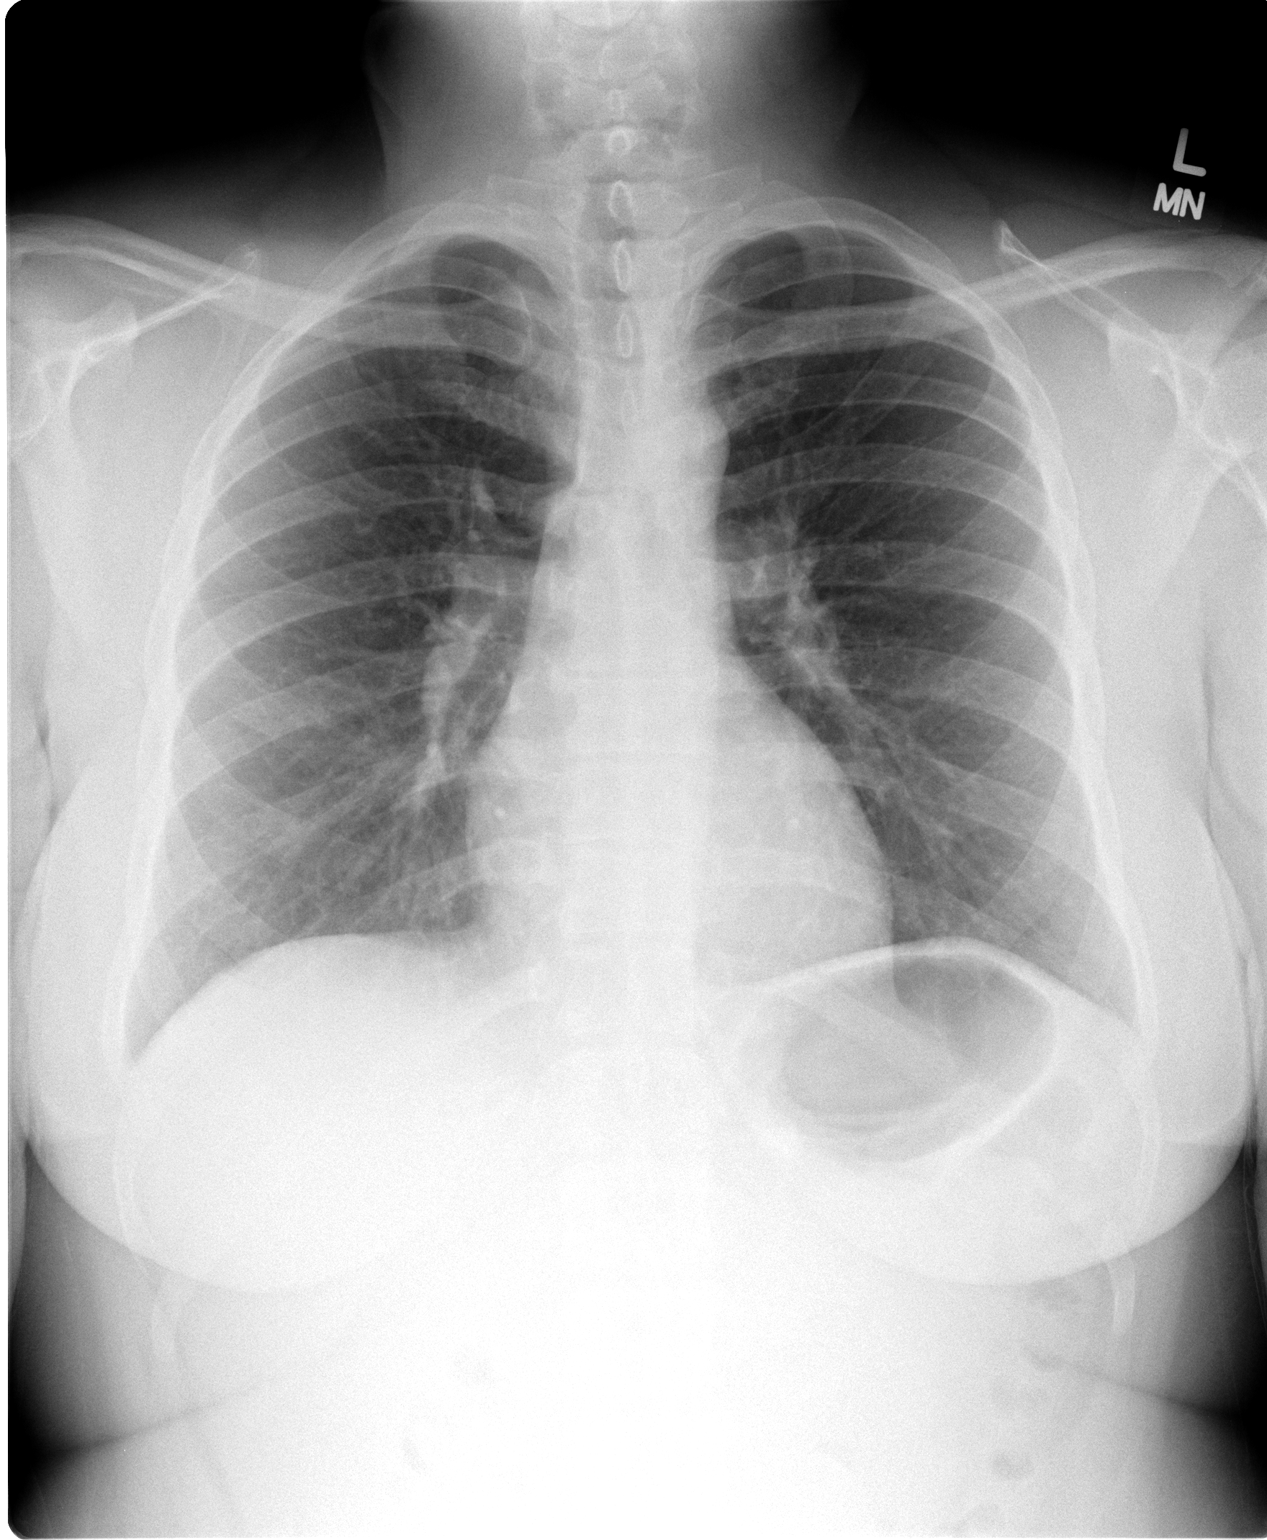

[view not recorded (2 of 2)]
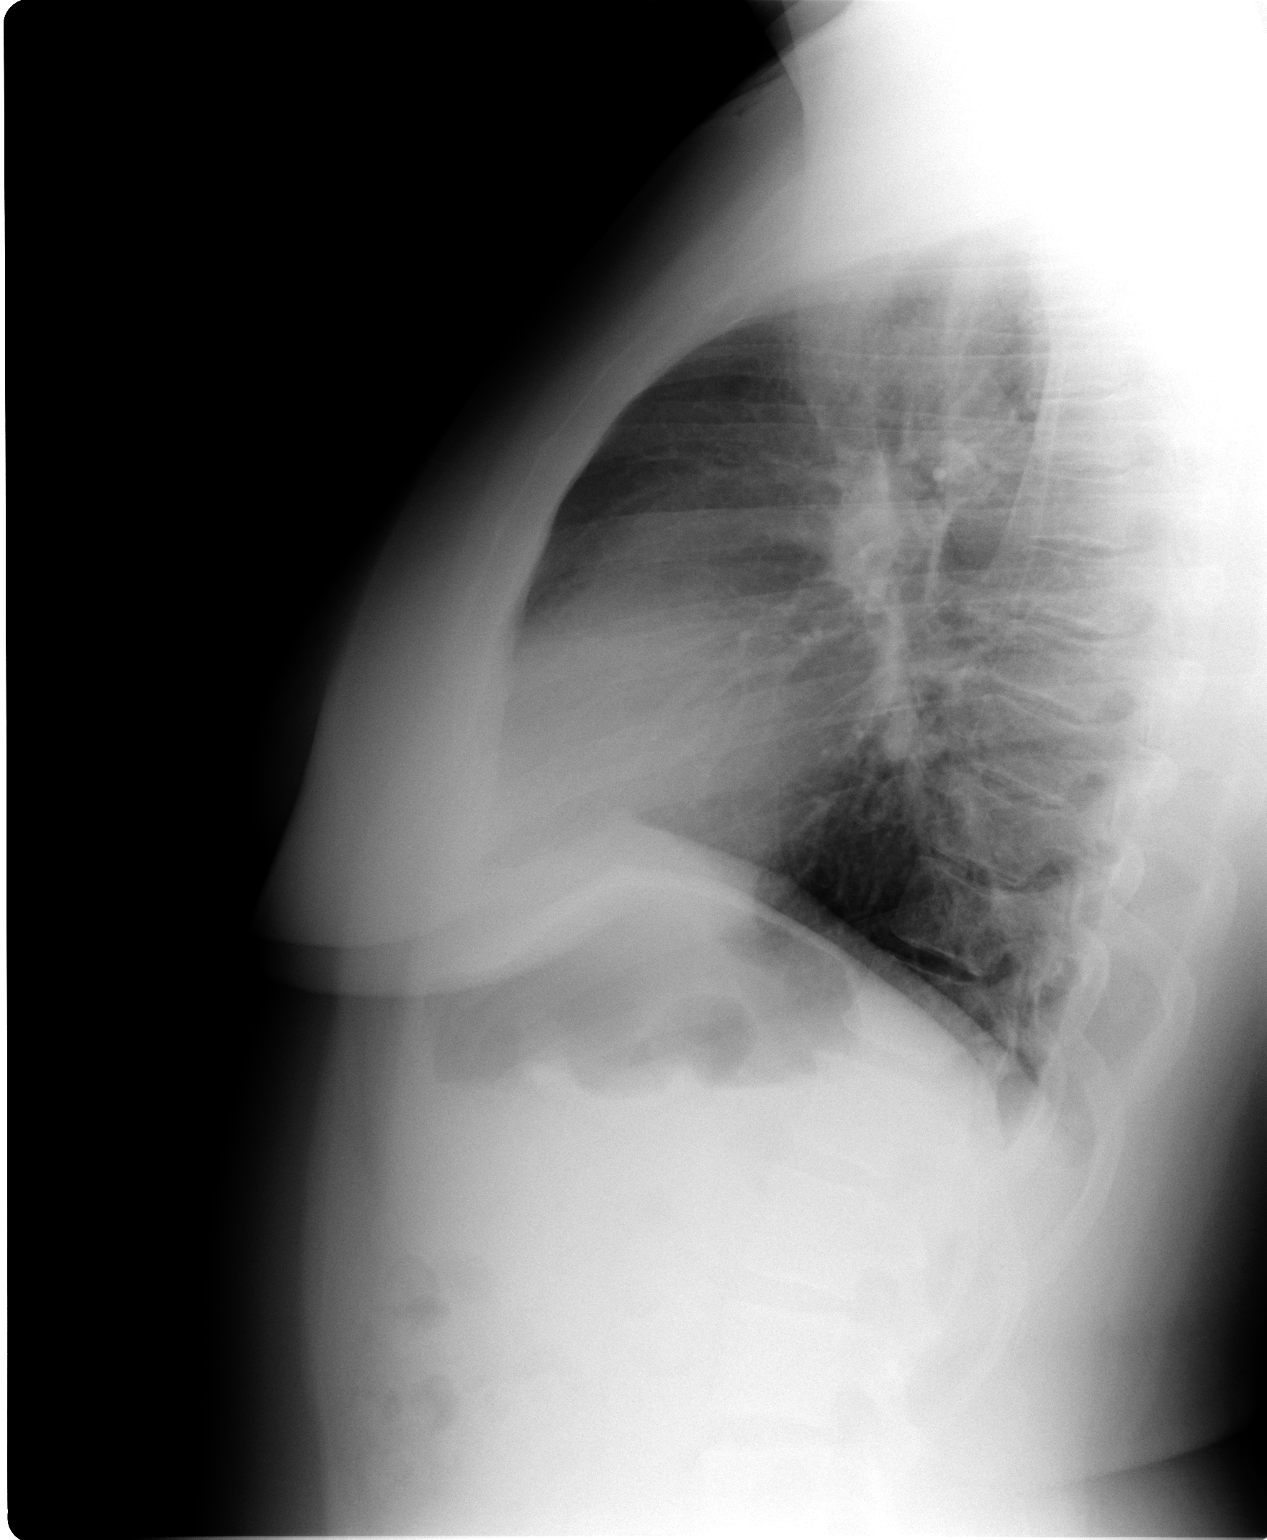

[2 of 2 positions shown; findings below may reference images not displayed]

FINDINGS: Cardiomediastinal silhouette unremarkable.  Lungs clear.
Bronchovascular markings normal.  Pulmonary vascularity normal.  No
pleural effusions.  No pneumothorax.  Visualized bony thorax
intact.
IMPRESSION: Normal examination.

## 2013-01-25 ENCOUNTER — Ambulatory Visit (INDEPENDENT_AMBULATORY_CARE_PROVIDER_SITE_OTHER): Payer: BC Managed Care – PPO | Admitting: Family Medicine

## 2013-01-25 ENCOUNTER — Encounter: Payer: Self-pay | Admitting: Family Medicine

## 2013-01-25 VITALS — BP 164/101 | HR 79 | Wt 172.0 lb

## 2013-01-25 DIAGNOSIS — M329 Systemic lupus erythematosus, unspecified: Secondary | ICD-10-CM

## 2013-01-25 DIAGNOSIS — I10 Essential (primary) hypertension: Secondary | ICD-10-CM

## 2013-01-25 DIAGNOSIS — Z23 Encounter for immunization: Secondary | ICD-10-CM

## 2013-01-25 LAB — LIPID PANEL: LDL Cholesterol: 101 mg/dL — ABNORMAL HIGH (ref 0–99)

## 2013-01-25 LAB — COMPLETE METABOLIC PANEL WITH GFR
ALT: 17 U/L (ref 0–35)
Albumin: 3.9 g/dL (ref 3.5–5.2)
CO2: 28 mEq/L (ref 19–32)
Chloride: 104 mEq/L (ref 96–112)
GFR, Est African American: >89 mL/min
GFR, Est Non African American: >89 mL/min
Glucose, Bld: 77 mg/dL (ref 70–99)
Potassium: 3.9 mEq/L (ref 3.5–5.3)
Sodium: 140 mEq/L (ref 135–145)
Total Protein: 7.6 g/dL (ref 6.0–8.3)

## 2013-01-25 MED ORDER — LOSARTAN POTASSIUM 100 MG PO TABS: 100.0000 mg | ORAL_TABLET | Freq: Every day | ORAL | Status: DC

## 2013-01-25 MED ORDER — AMLODIPINE BESYLATE 5 MG PO TABS: 5.0000 mg | ORAL_TABLET | Freq: Every day | ORAL | Status: DC

## 2013-01-25 NOTE — Progress Notes (Signed)
  Subjective:    Patient ID: Anna Patel, female    DOB: 1970/10/20, 42 y.o.   MRN: [id number]  HPI HTN  - has been out of meds. Has ben having HA. No CP, SOB, or palpitations.  ON ARB and CCB. No SE. she does follow regularly with her rheumatologist in [place]. She in fact she just had her eye exam last week she is on Plaquenil. She's also on low-dose prednisone daily.  Lupus and JRA-overall she's actually doing really well. She says her joint pain is under great control. She's currently on Plaquenil and prednisone. Review of Systems     Objective:   Physical Exam  Constitutional: She is oriented to person, place, and time. She appears well-developed and well-nourished.  HENT:  Head: Normocephalic and atraumatic.  Neck: Neck supple. No thyromegaly present.  Cardiovascular: Normal rate, regular rhythm and normal heart sounds.   Pulmonary/Chest: Effort normal and breath sounds normal.  Lymphadenopathy:    She has no cervical adenopathy.  Neurological: She is alert and oriented to person, place, and time.  Skin: Skin is warm and dry.  Psychiatric: She has a normal mood and affect. Her behavior is normal.          Assessment & Plan:  HTN- Uncontrolled. Restart medication. Followup in 6 weeks to recheck blood pressure. She is seeing her rheumatologist a week if her blood pressure looks much better next week back on her medications and she can have his office fax over the information vitals to me. She's due for CMP and fasting lipid panel he has been over a year. Encouraged her to go to the lab today.  Lupus/JRA-currently well controlled on current regimen and her eye exam is up-to-date.  Flu vaccine given today.

## 2013-01-29 ENCOUNTER — Ambulatory Visit: Payer: BC Managed Care – PPO | Admitting: Family Medicine

## 2014-01-31 ENCOUNTER — Encounter: Payer: BC Managed Care – PPO | Admitting: Family Medicine

## 2014-02-17 ENCOUNTER — Encounter: Payer: Self-pay | Admitting: Family Medicine

## 2014-02-17 ENCOUNTER — Other Ambulatory Visit (HOSPITAL_COMMUNITY)
Admission: RE | Admit: 2014-02-17 | Discharge: 2014-02-17 | Disposition: A | Discharge status: Home / Self Care | Payer: 59 | Source: Ambulatory Visit | Attending: Family Medicine | Admitting: Family Medicine

## 2014-02-17 ENCOUNTER — Ambulatory Visit (INDEPENDENT_AMBULATORY_CARE_PROVIDER_SITE_OTHER): Payer: 59 | Admitting: Family Medicine

## 2014-02-17 VITALS — BP 152/93 | HR 88 | Ht 64.0 in | Wt 187.0 lb

## 2014-02-17 DIAGNOSIS — R87810 Cervical high risk human papillomavirus (HPV) DNA test positive: Secondary | ICD-10-CM | POA: Insufficient documentation

## 2014-02-17 DIAGNOSIS — Z23 Encounter for immunization: Secondary | ICD-10-CM

## 2014-02-17 DIAGNOSIS — Z0000 Encounter for general adult medical examination without abnormal findings: Secondary | ICD-10-CM

## 2014-02-17 DIAGNOSIS — Z124 Encounter for screening for malignant neoplasm of cervix: Secondary | ICD-10-CM | POA: Diagnosis not present

## 2014-02-17 DIAGNOSIS — Z1151 Encounter for screening for human papillomavirus (HPV): Secondary | ICD-10-CM | POA: Insufficient documentation

## 2014-02-17 MED ORDER — OLMESARTAN-AMLODIPINE-HCTZ 40-5-12.5 MG PO TABS: 1.0000 | ORAL_TABLET | Freq: Every day | ORAL | Status: DC

## 2014-02-17 NOTE — Patient Instructions (Signed)
complete physical examination Keep up a regular exercise program and make sure you are eating a healthy diet Try to eat 4 servings of dairy a day, or if you are lactose intolerant take a calcium with vitamin D daily.  Your vaccines are up to date.   

## 2014-02-17 NOTE — Addendum Note (Signed)
Addended by: Teddy Spike on: 02/17/2014 10:42 AM   Modules accepted: Orders

## 2014-02-17 NOTE — Progress Notes (Signed)
  Subjective:     Anna Patel is a 43 y.o. female and is here for a comprehensive physical exam. The patient reports no problems.  History   Social History  . Marital Status: Married    Spouse Name: N/A    Number of Children: 3  . Years of Education: N/A   Occupational History  . [phone]    Social History Main Topics  . Smoking status: Never Smoker   . Smokeless tobacco: Not on file  . Alcohol Use: No  . Drug Use: No  . Sexual Activity:      Comment: married, adopted a sons, 1 abortion, works a English as a second language teacher, does aroebics 3 X week, trying to lose weight.   Other Topics Concern  . Not on file   Social History Narrative   Started working out this weeks.     Health Maintenance  Topic Date Due  . Pap Smear  07/23/2013  . Influenza Vaccine  12/28/2013  . Tetanus/tdap  07/20/2020    The following portions of the patient's history were reviewed and updated as appropriate: allergies, current medications, past family history, past medical history, past social history, past surgical history and problem list.  Review of Systems A comprehensive review of systems was negative.   Objective:    BP 152/93  Pulse 88  Ht 5\' 4"  (1.626 m)  Wt 187 lb (84.823 kg)  BMI 32.08 kg/m2 General appearance: alert, cooperative and appears stated age Head: Normocephalic, without obvious abnormality, atraumatic Eyes: conj clear, EOMi, PEERLA Ears: normal TM's and external ear canals both ears Nose: Nares normal. Septum midline. Mucosa normal. No drainage or sinus tenderness. Throat: lips, mucosa, and tongue normal; teeth and gums normal Neck: no adenopathy, no carotid bruit, no JVD, supple, symmetrical, trachea midline and thyroid not enlarged, symmetric, no tenderness/mass/nodules Back: symmetric, no curvature. ROM normal. No CVA tenderness. Lungs: clear to auscultation bilaterally Breasts: normal appearance, no masses or tenderness Heart: regular rate and rhythm, S1, S2 normal, no  murmur, click, rub or gallop Abdomen: soft, non-tender; bowel sounds normal; no masses,  no organomegaly Pelvic: cervix normal in appearance, external genitalia normal, no adnexal masses or tenderness, no cervical motion tenderness, rectovaginal septum normal, uterus normal size, shape, and consistency and vagina normal without discharge Extremities: extremities normal, atraumatic, no cyanosis or edema Pulses: 2+ and symmetric Skin: Skin color, texture, turgor normal. No rashes or lesions Lymph nodes: Cervical, supraclavicular, and axillary nodes normal. Neurologic: Alert and oriented X 3, normal strength and tone. Normal symmetric reflexes. Normal coordination and gait    Assessment:    Healthy female exam.      Plan:     See After Visit Summary for Counseling Recommendations  Keep up a regular exercise program and make sure you are eating a healthy diet Try to eat 4 servings of dairy a day, or if you are lactose intolerant take a calcium with vitamin D daily.  Your vaccines are up to date.  Will clal with pap smear results Flu vaccine given today.  Discussed need for mammogram.  Placed order today.

## 2014-02-18 ENCOUNTER — Telehealth: Payer: Self-pay | Admitting: *Deleted

## 2014-02-18 LAB — TSH: TSH: 1.253 u[IU]/mL (ref 0.350–4.500)

## 2014-02-18 LAB — COMPLETE METABOLIC PANEL WITH GFR
ALBUMIN: 3.7 g/dL (ref 3.5–5.2)
ALK PHOS: 73 U/L (ref 39–117)
ALT: 14 U/L (ref 0–35)
AST: 16 U/L (ref 0–37)
BUN: 12 mg/dL (ref 6–23)
CO2: 26 mEq/L (ref 19–32)
Calcium: 9 mg/dL (ref 8.4–10.5)
Chloride: 105 mEq/L (ref 96–112)
Creat: 0.8 mg/dL (ref 0.50–1.10)
GFR, Est African American: >89 mL/min
GLUCOSE: 80 mg/dL (ref 70–99)
POTASSIUM: 3.9 meq/L (ref 3.5–5.3)
Sodium: 140 mEq/L (ref 135–145)
Total Bilirubin: 0.5 mg/dL (ref 0.2–1.2)
Total Protein: 7.5 g/dL (ref 6.0–8.3)

## 2014-02-18 LAB — LIPID PANEL
Cholesterol: 164 mg/dL (ref 0–200)
HDL: 47 mg/dL (ref >39)
LDL Cholesterol: 93 mg/dL (ref 0–99)
TRIGLYCERIDES: 122 mg/dL (ref <150)
Total CHOL/HDL Ratio: 3.5 Ratio
VLDL: 24 mg/dL (ref 0–40)

## 2014-02-18 LAB — CYTOLOGY - PAP

## 2014-02-18 NOTE — Telephone Encounter (Signed)
Wellness forms copied, faxed and scanned.Anna Patel [place]

## 2014-02-24 ENCOUNTER — Telehealth: Payer: Self-pay | Admitting: *Deleted

## 2014-02-24 DIAGNOSIS — R8789 Other abnormal findings in specimens from female genital organs: Secondary | ICD-10-CM

## 2014-02-24 DIAGNOSIS — R87618 Other abnormal cytological findings on specimens from cervix uteri: Secondary | ICD-10-CM

## 2014-02-24 NOTE — Telephone Encounter (Signed)
Internal referral placed to GYN for abnormal pap and pos HPV

## 2014-02-26 ENCOUNTER — Telehealth: Payer: Self-pay | Admitting: *Deleted

## 2014-02-26 NOTE — Telephone Encounter (Signed)
Pt informed that her referral has been sent and that someone should be contacting her with regards to an appt.Anna Patel

## 2014-03-19 ENCOUNTER — Ambulatory Visit (INDEPENDENT_AMBULATORY_CARE_PROVIDER_SITE_OTHER): Payer: 59 | Admitting: Obstetrics & Gynecology

## 2014-03-19 ENCOUNTER — Encounter: Payer: Self-pay | Admitting: Obstetrics & Gynecology

## 2014-03-19 VITALS — Ht 64.0 in | Wt 186.0 lb

## 2014-03-19 DIAGNOSIS — R87810 Cervical high risk human papillomavirus (HPV) DNA test positive: Secondary | ICD-10-CM

## 2014-03-19 DIAGNOSIS — R87610 Atypical squamous cells of undetermined significance on cytologic smear of cervix (ASC-US): Secondary | ICD-10-CM

## 2014-03-19 LAB — POCT URINE PREGNANCY: Preg Test, Ur: NEGATIVE

## 2014-03-19 NOTE — Progress Notes (Signed)
   Subjective:    Patient ID: Anna Patel, female    DOB: 1971/05/01, 43 y.o.   MRN: [id number]  HPI  43yo M AA P3 (+2 adopted kids) is here today for a colpo due to a ASCUS + HR HPV pap. She had a LEEP about 18 years ago and reports normal paps since that time. She does have SLE.  Review of Systems BTL for contraception    Objective:   Physical Exam  UPT negative, consent signed, time out done Cervix prepped with acetic acid. Transformation zone seen in its entirety. Colpo adequate. Changes c/w h/o LEEP I did a random biopsy at the 7 o'clock position ECC obtained. She tolerated the procedure well.        Assessment & Plan:  Await pathology  I will call her with results

## 2014-03-31 ENCOUNTER — Encounter: Payer: Self-pay | Admitting: Obstetrics & Gynecology

## 2014-04-02 ENCOUNTER — Telehealth: Payer: Self-pay | Admitting: *Deleted

## 2014-04-02 NOTE — Telephone Encounter (Signed)
Called pt to adv Bx was negative and needs repeat PAP w/HPV in 1 year. Pt expressed understanding.

## 2014-04-18 ENCOUNTER — Other Ambulatory Visit: Payer: Self-pay | Admitting: *Deleted

## 2014-04-18 MED ORDER — OLMESARTAN-AMLODIPINE-HCTZ 40-5-12.5 MG PO TABS: 1.0000 | ORAL_TABLET | Freq: Every day | ORAL | Status: DC

## 2014-05-19 ENCOUNTER — Telehealth: Payer: Self-pay

## 2014-05-19 MED ORDER — AMLODIPINE BESYLATE 5 MG PO TABS: 5.0000 mg | ORAL_TABLET | Freq: Every day | ORAL | Status: DC

## 2014-05-19 NOTE — Telephone Encounter (Signed)
Ok . Can switch her to Benicar 40/12.5 and add amlodipine 5mg  QD.  Can give samples to cover for next 2 months.

## 2014-05-19 NOTE — Telephone Encounter (Signed)
Anna Patel called and left a message stating she has not be able to get her Tribenzor for the last month. When I called the pharmacy they stated the Tribenzor is not covered under her insurance plan. Please advise.

## 2014-05-19 NOTE — Telephone Encounter (Signed)
Patient advised and samples left up front.

## 2014-09-15 ENCOUNTER — Ambulatory Visit (INDEPENDENT_AMBULATORY_CARE_PROVIDER_SITE_OTHER): Payer: 59 | Admitting: Family Medicine

## 2014-09-15 ENCOUNTER — Encounter: Payer: Self-pay | Admitting: Family Medicine

## 2014-09-15 VITALS — BP 147/94 | HR 82 | Ht 64.0 in | Wt 186.0 lb

## 2014-09-15 DIAGNOSIS — Z1231 Encounter for screening mammogram for malignant neoplasm of breast: Secondary | ICD-10-CM | POA: Diagnosis not present

## 2014-09-15 DIAGNOSIS — K921 Melena: Secondary | ICD-10-CM | POA: Diagnosis not present

## 2014-09-15 DIAGNOSIS — I10 Essential (primary) hypertension: Secondary | ICD-10-CM | POA: Diagnosis not present

## 2014-09-15 DIAGNOSIS — N912 Amenorrhea, unspecified: Secondary | ICD-10-CM | POA: Diagnosis not present

## 2014-09-15 MED ORDER — OLMESARTAN MEDOXOMIL-HCTZ 40-12.5 MG PO TABS: 1.0000 | ORAL_TABLET | Freq: Every day | ORAL | Status: DC

## 2014-09-15 MED ORDER — AMLODIPINE BESYLATE 5 MG PO TABS: 5.0000 mg | ORAL_TABLET | Freq: Every day | ORAL | Status: DC

## 2014-09-15 NOTE — Progress Notes (Signed)
   Subjective:    Patient ID: Anna Patel, female    DOB: March 23, 1971, 44 y.o.   MRN: [id number]  HPI Hypertension- Pt denies chest pain, SOB, dizziness, or heart palpitations.  Taking meds as directed w/o problems.  Denies medication side effects.    Has noticed some blood in the stool on an doff for a few weeks.  Has had to strain more iwht stools. Still having one BM a day. Has tried to drink plenty of water and increase fiber.  No cramping. + bloating.  No family hx of crohn's dz.  Blood is bright red. Has been off prednisone x 6 months.   Says hasn't had a period since January.  Feels has some hotflashes.  Not sure when mom went through menopause. Hx of tubal ligation.  Last pap was in September about 7 mo ago. No new specidla diet.  No major weight changes.    Review of Systems     Objective:   Physical Exam  Constitutional: She is oriented to person, place, and time. She appears well-developed and well-nourished.  HENT:  Head: Normocephalic and atraumatic.  Cardiovascular: Normal rate, regular rhythm and normal heart sounds.   Pulmonary/Chest: Effort normal and breath sounds normal.  Genitourinary: Rectum normal. Rectal exam shows no external hemorrhoid, no fissure, no mass, no tenderness and anal tone normal. Guaiac negative stool.  Neurological: She is alert and oriented to person, place, and time.  Skin: Skin is warm and dry.  Psychiatric: She has a normal mood and affect. Her behavior is normal.          Assessment & Plan:  HTN - improved today to start medical but she has been out of her amlodipine for about 2 weeks. New prescription sent to pharmacy for the Benicar and amlodipine for 90 day supply. I like to see her back in about 3 months just to make sure the blood pressure is at goal and that we don't need to adjust her regimen.  Blood in stool - suspect secondary to straining. She did not have any extra hemorrhoids on exam. We tried to take a look with the  rectoscope but unfortunately we had to stop because of discomfort and pain. On digital exam I did not palpate any lesions or anything concerning. Will check CBC to evaluate for anemia. Discussed treatment to help soften her stools.  Amenorrhea - will check blood levels to see if she could be menopausal or perimenopausal. We'll also check her thyroid. And will check a CBC. If still no period in 3 mo then will do exam and repeat labswork.

## 2014-09-15 NOTE — Patient Instructions (Signed)
  Recommend a trial of the stool stop her 2-3 times per day. Can also try over-the-counter MiraLAX to try to soften the stools. If the stools are softer and you're still seeing blood and please let me know and we'll refer you to gastroenterology for further evaluation.  If hormone levels are fairly normal and you still have a period in the next 2 months and please call us back and let us know. We will schedule a pelvic exam at that time.

## 2014-09-16 LAB — CBC WITH DIFFERENTIAL/PLATELET
BASOS PCT: 0 % (ref 0–1)
Basophils Absolute: 0 10*3/uL (ref 0.0–0.1)
Eosinophils Absolute: 0.1 10*3/uL (ref 0.0–0.7)
Eosinophils Relative: 1 % (ref 0–5)
HCT: 42.8 % (ref 36.0–46.0)
HEMOGLOBIN: 14 g/dL (ref 12.0–15.0)
LYMPHS ABS: 2.1 10*3/uL (ref 0.7–4.0)
LYMPHS PCT: 29 % (ref 12–46)
MCH: 26.7 pg (ref 26.0–34.0)
MCHC: 32.7 g/dL (ref 30.0–36.0)
MCV: 81.5 fL (ref 78.0–100.0)
MONO ABS: 0.6 10*3/uL (ref 0.1–1.0)
MPV: 11.8 fL (ref 8.6–12.4)
Monocytes Relative: 8 % (ref 3–12)
Neutro Abs: 4.5 10*3/uL (ref 1.7–7.7)
Neutrophils Relative %: 62 % (ref 43–77)
PLATELETS: 222 10*3/uL (ref 150–400)
RBC: 5.25 MIL/uL — ABNORMAL HIGH (ref 3.87–5.11)
RDW: 14.7 % (ref 11.5–15.5)
WBC: 7.3 10*3/uL (ref 4.0–10.5)

## 2014-09-16 LAB — LUTEINIZING HORMONE: LH: 13.8 m[IU]/mL

## 2014-09-16 LAB — PREGNANCY, URINE: Preg Test, Ur: NEGATIVE

## 2014-09-16 LAB — ESTRADIOL: Estradiol: 91.6 pg/mL

## 2014-09-16 LAB — PROGESTERONE: Progesterone: 4 ng/mL

## 2014-09-16 LAB — FOLLICLE STIMULATING HORMONE: FSH: 10.8 m[IU]/mL

## 2014-09-16 LAB — TSH: TSH: 0.758 u[IU]/mL (ref 0.350–4.500)

## 2014-09-24 ENCOUNTER — Ambulatory Visit (INDEPENDENT_AMBULATORY_CARE_PROVIDER_SITE_OTHER): Payer: 59

## 2014-09-24 DIAGNOSIS — Z1231 Encounter for screening mammogram for malignant neoplasm of breast: Secondary | ICD-10-CM

## 2015-01-20 ENCOUNTER — Encounter: Payer: Self-pay | Admitting: Family Medicine

## 2015-01-20 ENCOUNTER — Ambulatory Visit (INDEPENDENT_AMBULATORY_CARE_PROVIDER_SITE_OTHER): Payer: 59 | Admitting: Family Medicine

## 2015-01-20 ENCOUNTER — Telehealth: Payer: Self-pay | Admitting: Family Medicine

## 2015-01-20 VITALS — BP 160/101 | HR 91 | Ht 64.0 in | Wt 187.0 lb

## 2015-01-20 DIAGNOSIS — R21 Rash and other nonspecific skin eruption: Secondary | ICD-10-CM | POA: Diagnosis not present

## 2015-01-20 DIAGNOSIS — I10 Essential (primary) hypertension: Secondary | ICD-10-CM

## 2015-01-20 DIAGNOSIS — Z23 Encounter for immunization: Secondary | ICD-10-CM

## 2015-01-20 MED ORDER — AMLODIPINE BESYLATE 10 MG PO TABS: 10.0000 mg | ORAL_TABLET | Freq: Every day | ORAL | Status: DC

## 2015-01-20 MED ORDER — KETOCONAZOLE 2 % EX CREA: 1.0000 "application " | TOPICAL_CREAM | Freq: Two times a day (BID) | CUTANEOUS | Status: DC

## 2015-01-20 NOTE — Progress Notes (Signed)
   Subjective:    Patient ID: Anna Patel, female    DOB: 28-Aug-1970, 44 y.o.   MRN: [id number]  HPI Hypertension- Pt denies chest pain, SOB, dizziness, or heart palpitations.  Taking meds as directed w/o problems.  Denies medication side effects.    Rash - Back of her neck x 2 weeks. Using benadryl cream.  Then switched to a Neosporin eczema cream.  Hx of eczema on the scalp.  Says occ itchy. This time not resolving on its own like it usually does.     Review of Systems     Objective:   Physical Exam  Constitutional: She is oriented to person, place, and time. She appears well-developed and well-nourished.  HENT:  Head: Normocephalic and atraumatic.  Cardiovascular: Normal rate.   Pulmonary/Chest: Effort normal.  Neurological: She is alert and oriented to person, place, and time.  Skin: Skin is warm. Rash noted.  She has a well demarcated rash on the posterior right neck. Extends to near border of hairline, towards the ear and down to top of upper back. approx 7-8 cm across. The lower border is actually raised.    Psychiatric: She has a normal mood and affect. Her behavior is normal.          Assessment & Plan:  HTN - uncontrolled. Recheck was about the same. Not sure why so elevated today. She says she took her medication. Will inc amlodipine to 10mg .  Recheck BP in 6 weeks.   Tinea corporis vs eczema - will tx with topical antifungal, ketoconazole.  Call if not improved after 2 weeks. Continue to apply at least one week after resolution of rash to kill spores.

## 2015-01-20 NOTE — Telephone Encounter (Signed)
Please call patient: Unknown increase her amlodipine to 10 mg. New perception sent to pharmacy. Have her schedule a nurse visit in about 4-6 weeks to recheck blood pressure to make sure at goal. Make sure watching diet. Eat low salt. Can check out DASH diet online.

## 2015-01-20 NOTE — Telephone Encounter (Signed)
Pt informed of recommendations.Anna Patel [place]  

## 2015-01-21 LAB — KOH PREP: RESULT - KOH: NONE SEEN

## 2015-03-03 ENCOUNTER — Other Ambulatory Visit (HOSPITAL_COMMUNITY)
Admission: RE | Admit: 2015-03-03 | Discharge: 2015-03-03 | Disposition: A | Discharge status: Home / Self Care | Payer: 59 | Source: Ambulatory Visit | Attending: Family Medicine | Admitting: Family Medicine

## 2015-03-03 ENCOUNTER — Encounter: Payer: Self-pay | Admitting: Family Medicine

## 2015-03-03 ENCOUNTER — Ambulatory Visit (INDEPENDENT_AMBULATORY_CARE_PROVIDER_SITE_OTHER): Payer: 59 | Admitting: Family Medicine

## 2015-03-03 VITALS — BP 126/81 | HR 82 | Ht 64.0 in | Wt 189.0 lb

## 2015-03-03 DIAGNOSIS — Z0000 Encounter for general adult medical examination without abnormal findings: Secondary | ICD-10-CM | POA: Diagnosis not present

## 2015-03-03 DIAGNOSIS — Z114 Encounter for screening for human immunodeficiency virus [HIV]: Secondary | ICD-10-CM | POA: Diagnosis not present

## 2015-03-03 DIAGNOSIS — Z1151 Encounter for screening for human papillomavirus (HPV): Secondary | ICD-10-CM | POA: Diagnosis not present

## 2015-03-03 DIAGNOSIS — Z01419 Encounter for gynecological examination (general) (routine) without abnormal findings: Secondary | ICD-10-CM | POA: Diagnosis present

## 2015-03-03 DIAGNOSIS — I10 Essential (primary) hypertension: Secondary | ICD-10-CM | POA: Diagnosis not present

## 2015-03-03 NOTE — Progress Notes (Signed)
  Subjective:     Anna Patel is a 44 y.o. female and is here for a comprehensive physical exam. The patient reports no problems.  Social History   Social History  . Marital Status: Married    Spouse Name: N/A  . Number of Children: 3  . Years of Education: N/A   Occupational History  . [phone]    Social History Main Topics  . Smoking status: Never Smoker   . Smokeless tobacco: Not on file  . Alcohol Use: No  . Drug Use: No  . Sexual Activity: Yes    Birth Control/ Protection: None     Comment: married, adopted a sons, 1 abortion, works a English as a second language teacher, does aroebics 3 X week, trying to lose weight.   Other Topics Concern  . Not on file   Social History Narrative   Started working out this weeks.     Health Maintenance  Topic Date Due  . HIV Screening  11/12/1987  . INFLUENZA VACCINE  12/29/2015  . PAP SMEAR  02/17/2017  . TETANUS/TDAP  07/20/2020    The following portions of the patient's history were reviewed and updated as appropriate: allergies, current medications, past family history, past medical history, past social history, past surgical history and problem list.  Review of Systems Pertinent items noted in HPI and remainder of comprehensive ROS otherwise negative.   Objective:    BP 126/81 mmHg  Pulse 82  Ht 5\' 4"  (1.626 m)  Wt 189 lb (85.73 kg)  BMI 32.43 kg/m2  LMP 02/05/2015 (Exact Date) General appearance: alert, cooperative and appears stated age Head: Normocephalic, without obvious abnormality, atraumatic Eyes: conj clear, EOMI, PEERLA Ears: normal TM's and external ear canals both ears Nose: Nares normal. Septum midline. Mucosa normal. No drainage or sinus tenderness. Throat: lips, mucosa, and tongue normal; teeth and gums normal Neck: no adenopathy, no carotid bruit, no JVD, supple, symmetrical, trachea midline and thyroid not enlarged, symmetric, no tenderness/mass/nodules Back: symmetric, no curvature. ROM normal. No CVA  tenderness. Lungs: clear to auscultation bilaterally Breasts: normal appearance, no masses or tenderness Heart: regular rate and rhythm, S1, S2 normal, no murmur, click, rub or gallop Abdomen: soft, non-tender; bowel sounds normal; no masses,  no organomegaly Pelvic: cervix normal in appearance, external genitalia normal, no adnexal masses or tenderness, no cervical motion tenderness, rectovaginal septum normal, uterus normal size, shape, and consistency and vagina normal without discharge Extremities: extremities normal, atraumatic, no cyanosis or edema Pulses: 2+ and symmetric Skin: Skin color, texture, turgor normal. No rashes or lesions Lymph nodes: Cervical, supraclavicular, and axillary nodes normal. Neurologic: Alert and oriented X 3, normal strength and tone. Normal symmetric reflexes. Normal coordination and gait    Assessment:    Healthy female exam.      Plan:     See After Visit Summary for Counseling Recommendations  Keep up a regular exercise program and make sure you are eating a healthy diet Try to eat 4 servings of dairy a day, or if you are lactose intolerant take a calcium with vitamin D daily.  Your vaccines are up to date.  Due fore repeat pap with HIV.

## 2015-03-03 NOTE — Patient Instructions (Signed)
Keep up a regular exercise program and make sure you are eating a healthy diet Try to eat 4 servings of dairy a day, or if you are lactose intolerant take a calcium with vitamin D daily.  Your vaccines are up to date.   

## 2015-03-06 LAB — CYTOLOGY - PAP

## 2015-03-11 LAB — LIPID PANEL
CHOLESTEROL: 176 mg/dL (ref 125–200)
HDL: 43 mg/dL — ABNORMAL LOW (ref >=46)
LDL Cholesterol: 113 mg/dL (ref <130)
TRIGLYCERIDES: 101 mg/dL (ref <150)
Total CHOL/HDL Ratio: 4.1 Ratio (ref <=5.0)
VLDL: 20 mg/dL (ref <30)

## 2015-03-11 LAB — COMPLETE METABOLIC PANEL WITH GFR
ALT: 13 U/L (ref 6–29)
AST: 16 U/L (ref 10–30)
Albumin: 3.1 g/dL — ABNORMAL LOW (ref 3.6–5.1)
Alkaline Phosphatase: 64 U/L (ref 33–115)
BUN: 11 mg/dL (ref 7–25)
CALCIUM: 8.7 mg/dL (ref 8.6–10.2)
CHLORIDE: 104 mmol/L (ref 98–110)
CO2: 29 mmol/L (ref 20–31)
Creat: 0.66 mg/dL (ref 0.50–1.10)
GFR, Est Non African American: >89 mL/min (ref >=60)
Glucose, Bld: 77 mg/dL (ref 65–99)
Potassium: 3.4 mmol/L — ABNORMAL LOW (ref 3.5–5.3)
Sodium: 139 mmol/L (ref 135–146)
Total Bilirubin: 0.3 mg/dL (ref 0.2–1.2)
Total Protein: 6.7 g/dL (ref 6.1–8.1)

## 2015-03-11 LAB — HIV ANTIBODY (ROUTINE TESTING W REFLEX): HIV 1&2 Ab, 4th Generation: NONREACTIVE

## 2015-03-11 NOTE — Progress Notes (Signed)
[place]

## 2015-04-24 ENCOUNTER — Encounter: Payer: Self-pay | Admitting: *Deleted

## 2015-04-24 ENCOUNTER — Emergency Department (INDEPENDENT_AMBULATORY_CARE_PROVIDER_SITE_OTHER)
Admission: EM | Admit: 2015-04-24 | Discharge: 2015-04-24 | Disposition: A | Discharge status: Home / Self Care | Payer: 59 | Source: Home / Self Care | Attending: Family Medicine | Admitting: Family Medicine

## 2015-04-24 DIAGNOSIS — R05 Cough: Secondary | ICD-10-CM | POA: Diagnosis not present

## 2015-04-24 DIAGNOSIS — R059 Cough, unspecified: Secondary | ICD-10-CM

## 2015-04-24 MED ORDER — AZITHROMYCIN 250 MG PO TABS: ORAL_TABLET | ORAL | Status: DC

## 2015-04-24 MED ORDER — PREDNISONE 20 MG PO TABS: 20.0000 mg | ORAL_TABLET | Freq: Two times a day (BID) | ORAL | Status: DC

## 2015-04-24 NOTE — Discharge Instructions (Signed)
Take plain guaifenesin (1200mg  extended release tabs such as Mucinex) twice daily, with plenty of water, for cough and congestion.   Begin Azithromycin if not improving about one week or if persistent fever develops   Follow-up with family doctor if not improving about10 days.

## 2015-04-24 NOTE — ED Provider Notes (Signed)
CSN: [id number]     Arrival date & time 04/24/15  1104 History   First MD Initiated Contact with Patient 04/24/15 1247     Chief Complaint  Patient presents with  . Cough      HPI Comments: The patient reports that she had a URI about 1.5 months ago that lasted a week.  She felt well until 3 days ago when she was sitting under an AC vent at work.  She subsequently developed a partly productive cough.  Last night she developed chills and fever to 100.6.  No other URI symptoms.  No shortness of breath or wheezing.  The history is provided by the patient.    Past Medical History  Diagnosis Date  . Chronic steroid use   . 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 SLE (systemic lupus erythematosus) ([place])   . Preeclampsia     delivery 37 weeks   Past Surgical History  Procedure Laterality Date  . Tubal ligation     Family History  Problem Relation Age of Onset  . Heart disease Mother     In her 51's  . Hypertension Mother   . Lupus Paternal Aunt     Deceased  . Stroke Mother     CVA x 2, in her 94s  . Breast cancer Mother     DCIS  . Goiter Mother    Social History  Substance Use Topics  . Smoking status: Never Smoker   . Smokeless tobacco: None  . Alcohol Use: No   OB History    Gravida Para Term Preterm AB TAB SAB Ectopic Multiple Living   1 1        1      Review of Systems + sore throat + cough No pleuritic pain No wheezing No nasal congestion ? post-nasal drainage No sinus pain/pressure No itchy/red eyes No earache No hemoptysis No SOB + fever, + chills No nausea No vomiting No abdominal pain No diarrhea No urinary symptoms No skin rash No fatigue No myalgias No headache Used OTC meds without relief  Allergies  Sulfa antibiotics and Sulfonamide derivatives  Home Medications   Prior to Admission medications   Medication Sig Start Date End Date Taking? Authorizing Provider  amLODipine (NORVASC) 10 MG tablet Take 1 tablet (10 mg total) by  mouth daily. 01/20/15   Hali Marry, MD  aspirin EC 81 MG tablet Take 81 mg by mouth daily.      Historical Provider, MD  azithromycin (ZITHROMAX Z-PAK) 250 MG tablet Take 2 tabs today; then begin one tab once daily for 4 more days. (Rx void after 05/04/15) 04/24/15   Kandra Nicolas, MD  Calcium Carbonate-Vitamin D 500-125 MG-UNIT TABS Take by mouth 2 (two) times daily.      Historical Provider, MD  ergocalciferol (VITAMIN D2) 50000 UNITS capsule  09/17/10   Historical Provider, MD  hydroxychloroquine (PLAQUENIL) 200 MG tablet Take by mouth 2 (two) times daily.      Historical Provider, MD  Multiple Vitamin (MULTIVITAMIN) tablet Take 1 tablet by mouth daily.      Historical Provider, MD  olmesartan-hydrochlorothiazide (BENICAR HCT) 40-12.5 MG per tablet Take 1 tablet by mouth daily. 09/15/14   Hali Marry, MD  predniSONE (DELTASONE) 20 MG tablet Take 1 tablet (20 mg total) by mouth 2 (two) times daily. Take with food. 04/24/15   Kandra Nicolas, MD   Meds Ordered and Administered this Visit  Medications - No data to display  BP 147/101 mmHg  Pulse 86  Temp(Src) 98.7 F (37.1 C) (Oral)  Ht 5\' 4"  (1.626 m)  Wt 186 lb (84.369 kg)  BMI 31.91 kg/m2  SpO2 95% No data found.   Physical Exam Nursing notes and Vital Signs reviewed. Appearance:  Patient appears stated age, and in no acute distress.  Patient is obese (BMI 31.9) Eyes:  Pupils are equal, round, and reactive to light and accomodation.  Extraocular movement is intact.  Conjunctivae are not inflamed  Ears:  Canals normal.  Tympanic membranes normal.  Nose:  Normal turbinates.  No sinus tenderness.    Pharynx:  Normal Neck:  Supple.   No adenopathy Lungs:  Clear to auscultation.  Breath sounds are equal.  Moving air well. Heart:  Regular rate and rhythm without murmurs, rubs, or gallops.  Abdomen:  Nontender without masses or hepatosplenomegaly.  Bowel sounds are present.  No CVA or flank tenderness.  Extremities:   No edema.  No calf tenderness Skin:  No rash present.   ED Course  Procedures none  MDM   1. Cough; ?allergy mediated; ?early URI     Take plain guaifenesin (1200mg  extended release tabs such as Mucinex) twice daily, with plenty of water, for cough and congestion.   Begin Azithromycin if not improving about one week or if persistent fever develops (Given a prescription to hold, with an expiration date)  Follow-up with family doctor if not improving about10 days.     Kandra Nicolas, MD 04/29/15 [phone]

## 2015-04-24 NOTE — ED Notes (Signed)
Pt states she has had a cough for about 6 weeks.  It is non productive and worse at night.

## 2015-07-28 ENCOUNTER — Other Ambulatory Visit: Payer: Self-pay | Admitting: Family Medicine

## 2015-09-01 ENCOUNTER — Telehealth: Payer: Self-pay | Admitting: *Deleted

## 2015-09-01 ENCOUNTER — Encounter: Payer: Self-pay | Admitting: Family Medicine

## 2015-09-01 ENCOUNTER — Ambulatory Visit (INDEPENDENT_AMBULATORY_CARE_PROVIDER_SITE_OTHER): Payer: 59 | Admitting: Family Medicine

## 2015-09-01 VITALS — BP 131/80 | HR 76 | Ht 64.0 in | Wt 182.0 lb

## 2015-09-01 DIAGNOSIS — N926 Irregular menstruation, unspecified: Secondary | ICD-10-CM | POA: Diagnosis not present

## 2015-09-01 DIAGNOSIS — I10 Essential (primary) hypertension: Secondary | ICD-10-CM

## 2015-09-01 DIAGNOSIS — M329 Systemic lupus erythematosus, unspecified: Secondary | ICD-10-CM | POA: Diagnosis not present

## 2015-09-01 MED ORDER — AMLODIPINE BESYLATE 10 MG PO TABS: 10.0000 mg | ORAL_TABLET | Freq: Every day | ORAL | Status: DC

## 2015-09-01 NOTE — Telephone Encounter (Signed)
Pt's biometric screening faxed, copy made, scanned, and confirmation received.Anna Patel [place]

## 2015-09-01 NOTE — Progress Notes (Signed)
   Subjective:    Patient ID: Anna Patel, female    DOB: 1970-10-14, 45 y.o.   MRN: [id number]  HPI Hypertension f/u 6 mo -  Pt denies chest pain, SOB, dizziness, or heart palpitations.  Taking meds as directed w/o problems.  Denies medication side effects.  No swelling.   SLE- she is now off of prednisone.  On Plaquenil. Follows regularly with her rhuem in [place].    Irregular periods - She still having irregular periods. This last period about a month ago was a lot of spotting. She felt it wasn't a "normal" periods.     Review of Systems     Objective:   Physical Exam  Constitutional: She is oriented to person, place, and time. She appears well-developed and well-nourished.  HENT:  Head: Normocephalic and atraumatic.  Cardiovascular: Normal rate, regular rhythm and normal heart sounds.   Pulmonary/Chest: Effort normal and breath sounds normal.  Neurological: She is alert and oriented to person, place, and time.  Skin: Skin is warm and dry.  Psychiatric: She has a normal mood and affect. Her behavior is normal.          Assessment & Plan:  HTN - Well controlled. Continue current regimen. Follow up in 6 mo   SLE - Stable. She is off steroids!!   Irregular periods - She had lab work for female hormones about go was normal. Consider PCO S on the differential diagnosis. Will get pelvic ultrasound for further evaluation.  Her rheumatologist does not want her on birth control. Could consider metformin if she has evidence of polycystic ovary.

## 2015-09-08 ENCOUNTER — Ambulatory Visit (INDEPENDENT_AMBULATORY_CARE_PROVIDER_SITE_OTHER): Payer: 59

## 2015-09-08 DIAGNOSIS — N926 Irregular menstruation, unspecified: Secondary | ICD-10-CM

## 2015-09-08 DIAGNOSIS — D259 Leiomyoma of uterus, unspecified: Secondary | ICD-10-CM | POA: Diagnosis not present

## 2015-09-09 LAB — BASIC METABOLIC PANEL
BUN: 7 mg/dL (ref 7–25)
CHLORIDE: 101 mmol/L (ref 98–110)
CO2: 27 mmol/L (ref 20–31)
CREATININE: 0.62 mg/dL (ref 0.50–1.10)
Calcium: 9.3 mg/dL (ref 8.6–10.2)
Glucose, Bld: 86 mg/dL (ref 65–99)
Potassium: 3.5 mmol/L (ref 3.5–5.3)
Sodium: 137 mmol/L (ref 135–146)

## 2015-09-09 NOTE — Progress Notes (Signed)
Quick Note:  All labs are normal. ______ 

## 2015-09-11 LAB — BASIC METABOLIC PANEL
BUN: 5 mg/dL (ref 4–21)
BUN: 5 mg/dL (ref 4–21)
CREATININE: 0.8 mg/dL (ref 0.5–1.1)
Creatinine: 0.8 mg/dL (ref 0.5–1.1)
GLUCOSE: 93 mg/dL
GLUCOSE: 93 mg/dL
POTASSIUM: 3.7 mmol/L (ref 3.4–5.3)
POTASSIUM: 3.7 mmol/L (ref 3.4–5.3)
SODIUM: 141 mmol/L (ref 137–147)
Sodium: 141 mmol/L (ref 137–147)

## 2015-09-11 LAB — CBC AND DIFFERENTIAL
HCT: 44 % (ref 36–46)
HEMOGLOBIN: 14.7 g/dL (ref 12.0–16.0)
Hemoglobin: 14.7 g/dL (ref 12.0–16.0)
PLATELETS: 223 10*3/uL (ref 150–399)
Platelets: 223 10*3/uL (ref 150–399)
WBC: 5.6 10^3/mL
WBC: 5.6 10^3/mL

## 2015-09-11 LAB — HEPATIC FUNCTION PANEL
ALT: 10 U/L (ref 7–35)
ALT: 10 U/L (ref 7–35)
AST: 14 U/L (ref 13–35)
AST: 14 U/L (ref 13–35)
Alkaline Phosphatase: 75 U/L (ref 25–125)
Alkaline Phosphatase: 75 U/L (ref 25–125)

## 2015-09-11 LAB — POCT ERYTHROCYTE SEDIMENTATION RATE, NON-AUTOMATED: SED RATE: 34 mm

## 2015-09-11 LAB — VITAMIN D 25 HYDROXY (VIT D DEFICIENCY, FRACTURES): Vit D, 25-Hydroxy: 74.7

## 2015-09-30 ENCOUNTER — Other Ambulatory Visit: Payer: Self-pay | Admitting: Family Medicine

## 2016-03-10 ENCOUNTER — Encounter: Payer: 59 | Admitting: Family Medicine

## 2016-03-17 ENCOUNTER — Ambulatory Visit (INDEPENDENT_AMBULATORY_CARE_PROVIDER_SITE_OTHER): Payer: 59 | Admitting: Family Medicine

## 2016-03-17 ENCOUNTER — Encounter: Payer: Self-pay | Admitting: Family Medicine

## 2016-03-17 VITALS — BP 133/80 | HR 77 | Ht 64.0 in | Wt 186.0 lb

## 2016-03-17 DIAGNOSIS — Z23 Encounter for immunization: Secondary | ICD-10-CM

## 2016-03-17 DIAGNOSIS — Z0000 Encounter for general adult medical examination without abnormal findings: Secondary | ICD-10-CM | POA: Diagnosis not present

## 2016-03-17 NOTE — Progress Notes (Signed)
   Subjective:     Anna Patel is a 45 y.o. female and is here for a comprehensive physical exam. The patient reports no problems.  Social History   Social History  . Marital status: Married    Spouse name: N/A  . Number of children: 3  . Years of education: N/A   Occupational History  . [employer]    Social History Main Topics  . Smoking status: Never Smoker  . Smokeless tobacco: Not on file  . Alcohol use No  . Drug use: No  . Sexual activity: Yes    Birth control/ protection: None     Comment: married, adopted a sons, 1 abortion, works a English as a second language teacher, does aroebics 3 X week, trying to lose weight.   Other Topics Concern  . Not on file   Social History Narrative   Some exercise but not regular.  Works in a Product manager.    Health Maintenance  Topic Date Due  . INFLUENZA VACCINE  12/29/2015  . PAP SMEAR  03/02/2020  . TETANUS/TDAP  07/20/2020  . HIV Screening  Completed    The following portions of the patient's history were reviewed and updated as appropriate: allergies, current medications, past family history, past medical history, past social history, past surgical history and problem list.  Review of Systems A comprehensive review of systems was negative.   Objective:    BP 133/80   Pulse 77   Ht 5\' 4"  (1.626 m)   Wt 186 lb (84.4 kg)   SpO2 100%   BMI 31.93 kg/m  General appearance: alert, cooperative and appears stated age Head: Normocephalic, without obvious abnormality, atraumatic Eyes: conj clear, EOMI, pEERLA Ears: normal TM's and external ear canals both ears Nose: Nares normal. Septum midline. Mucosa normal. No drainage or sinus tenderness. Throat: lips, mucosa, and tongue normal; teeth and gums normal Neck: no adenopathy, no carotid bruit, no JVD, supple, symmetrical, trachea midline and thyroid not enlarged, symmetric, no tenderness/mass/nodules Back: symmetric, no curvature. ROM normal. No CVA tenderness. Lungs: clear to  auscultation bilaterally Breasts: normal appearance, no masses or tenderness Heart: regular rate and rhythm, S1, S2 normal, no murmur, click, rub or gallop Abdomen: soft, non-tender; bowel sounds normal; no masses,  no organomegaly Extremities: extremities normal, atraumatic, no cyanosis or edema Pulses: 2+ and symmetric Skin: Skin color, texture, turgor normal. No rashes or lesions Lymph nodes: Cervical, supraclavicular, and axillary nodes normal. Neurologic: Alert and oriented X 3, normal strength and tone. Normal symmetric reflexes. Normal coordination and gait    Assessment:    Healthy female exam.      Plan:     See After Visit Summary for Counseling Recommendations   Keep up a regular exercise program and make sure you are eating a healthy diet Try to eat 4 servings of dairy a day, or if you are lactose intolerant take a calcium with vitamin D daily.  Your vaccines are up to date.  Flu vaccine given.

## 2016-03-17 NOTE — Patient Instructions (Signed)
Keep up a regular exercise program and make sure you are eating a healthy diet Try to eat 4 servings of dairy a day, or if you are lactose intolerant take a calcium with vitamin D daily.  Your vaccines are up to date.   

## 2016-04-01 LAB — URINALYSIS, MICROSCOPIC ONLY
Casts: NONE SEEN [LPF]
Yeast: NONE SEEN [HPF]

## 2016-04-01 LAB — URINALYSIS, ROUTINE W REFLEX MICROSCOPIC
Bilirubin Urine: NEGATIVE
Glucose, UA: NEGATIVE
HGB URINE DIPSTICK: NEGATIVE
KETONES UR: NEGATIVE
NITRITE: NEGATIVE
Specific Gravity, Urine: 1.023 (ref 1.001–1.035)
pH: 6.5 (ref 5.0–8.0)

## 2016-04-01 LAB — LIPID PANEL
CHOL/HDL RATIO: 3.5 ratio (ref <=5.0)
Cholesterol: 166 mg/dL (ref 125–200)
HDL: 48 mg/dL (ref >=46)
LDL CALC: 101 mg/dL (ref <130)
Triglycerides: 86 mg/dL (ref <150)
VLDL: 17 mg/dL (ref <30)

## 2016-04-01 LAB — COMPLETE METABOLIC PANEL WITH GFR
ALT: 11 U/L (ref 6–29)
AST: 17 U/L (ref 10–30)
Albumin: 3.6 g/dL (ref 3.6–5.1)
Alkaline Phosphatase: 73 U/L (ref 33–115)
BUN: 11 mg/dL (ref 7–25)
CHLORIDE: 106 mmol/L (ref 98–110)
CO2: 29 mmol/L (ref 20–31)
Calcium: 8.9 mg/dL (ref 8.6–10.2)
Creat: 0.79 mg/dL (ref 0.50–1.10)
GFR, Est African American: >89 mL/min (ref >=60)
GFR, Est Non African American: >89 mL/min (ref >=60)
GLUCOSE: 84 mg/dL (ref 65–99)
POTASSIUM: 3.5 mmol/L (ref 3.5–5.3)
SODIUM: 142 mmol/L (ref 135–146)
Total Bilirubin: 0.3 mg/dL (ref 0.2–1.2)
Total Protein: 7.3 g/dL (ref 6.1–8.1)

## 2016-04-01 LAB — TSH: TSH: 0.81 m[IU]/L

## 2016-07-05 ENCOUNTER — Telehealth: Payer: Self-pay

## 2016-07-05 ENCOUNTER — Other Ambulatory Visit: Payer: Self-pay

## 2016-07-05 MED ORDER — OLMESARTAN MEDOXOMIL-HCTZ 40-12.5 MG PO TABS: 1.0000 | ORAL_TABLET | Freq: Every day | ORAL | 1 refills | Status: DC

## 2016-07-05 NOTE — Telephone Encounter (Signed)
Request Reference Number: [id number]. BENICAR HCT TAB 40-12.5 is denied due to Plan Exclusion.

## 2016-07-07 MED ORDER — LOSARTAN POTASSIUM-HCTZ 100-25 MG PO TABS: 1.0000 | ORAL_TABLET | Freq: Every day | ORAL | 1 refills | Status: DC

## 2016-07-07 NOTE — Telephone Encounter (Signed)
Call pt and see if ok to change to losartan HCT?

## 2016-07-07 NOTE — Telephone Encounter (Signed)
Prescription printed. She did not have a pharmacy on file. Yes I want her to continue with the amlodipine as well. Okay to refill if needed.  Beatrice Lecher, MD

## 2016-07-07 NOTE — Telephone Encounter (Signed)
Pt advised and Rx sent.

## 2016-07-07 NOTE — Addendum Note (Signed)
Addended by: Beatrice Lecher D on: 07/07/2016 12:21 PM   Modules accepted: Orders

## 2016-07-07 NOTE — Telephone Encounter (Signed)
Pt is OK to change to Losartan. Questions if she should continue the amlodipine along with Losartan.  Would like rx sent to: [facility]

## 2016-08-01 ENCOUNTER — Ambulatory Visit (INDEPENDENT_AMBULATORY_CARE_PROVIDER_SITE_OTHER): Payer: 59 | Admitting: Family Medicine

## 2016-08-01 VITALS — BP 124/83 | HR 87 | Temp 98.2°F | Wt 193.0 lb

## 2016-08-01 DIAGNOSIS — M328 Other forms of systemic lupus erythematosus: Secondary | ICD-10-CM

## 2016-08-01 DIAGNOSIS — J029 Acute pharyngitis, unspecified: Secondary | ICD-10-CM

## 2016-08-01 DIAGNOSIS — I10 Essential (primary) hypertension: Secondary | ICD-10-CM | POA: Diagnosis not present

## 2016-08-01 DIAGNOSIS — R8299 Other abnormal findings in urine: Secondary | ICD-10-CM | POA: Diagnosis not present

## 2016-08-01 DIAGNOSIS — M0800 Unspecified juvenile rheumatoid arthritis of unspecified site: Secondary | ICD-10-CM

## 2016-08-01 DIAGNOSIS — R82998 Other abnormal findings in urine: Secondary | ICD-10-CM

## 2016-08-01 LAB — POCT URINALYSIS DIPSTICK
Bilirubin, UA: NEGATIVE
GLUCOSE UA: NEGATIVE
Ketones, UA: NEGATIVE
NITRITE UA: NEGATIVE
PH UA: 6
Protein, UA: 30
Spec Grav, UA: >=1.03
UROBILINOGEN UA: 0.2

## 2016-08-01 NOTE — Progress Notes (Signed)
Subjective:    Patient ID: Anna Patel, female    DOB: March 05, 1971, 46 y.o.   MRN: [id number]  HPI 46 year old female female comes in today with difficulty swallowing and ST x 3 days.  Feels worse on the left side. She actually says it feels a little better today. No fevers chills or sweats. No runny nose congestion or cough.  She's also requesting a rheumatology referral For lupus and rheumatoid. She prefers to see Dr. Delphia Grates.  Hypertension- Pt denies chest pain, SOB, dizziness, or heart palpitations.  Taking meds as directed w/o problems.  Denies medication side effects.    She would also like to have her urine rechecked today. We have actually checked it back in November. At times she felt like she was really not having any symptoms so even though the specimen was contaminated she decided not to come back in for re-collection. But in the last couple weeks she's just hasn't felt well when she first gets up in the morning. She denies any hematuria or dysuria but would like to have her urine checked.   Review of Systems  BP 124/83   Pulse 87   Temp 98.2 F (36.8 C) (Oral)   Wt 193 lb (87.5 kg)   SpO2 100%   BMI 33.13 kg/m     Allergies  Allergen Reactions  . Sulfa Antibiotics Nausea And Vomiting    Aggravates lupus symptoms  . Sulfonamide Derivatives     Sulfa drugs trigger patient's lupsus    Past Medical History:  Diagnosis Date  . Chronic steroid use   . JRA (juvenile rheumatoid arthritis) ([place])   . Preeclampsia    delivery 37 weeks  . SLE (systemic lupus erythematosus) ([place])     Past Surgical History:  Procedure Laterality Date  . TUBAL LIGATION      Social History   Social History  . Marital status: Married    Spouse name: N/A  . Number of children: 3  . Years of education: N/A   Occupational History  . [employer]    Social History Main Topics  . Smoking status: Never Smoker  . Smokeless tobacco: Not on file  . Alcohol use  No  . Drug use: No  . Sexual activity: Yes    Birth control/ protection: None     Comment: married, adopted a sons, 1 abortion, works a English as a second language teacher, does aroebics 3 X week, trying to lose weight.   Other Topics Concern  . Not on file   Social History Narrative   Some exercise but not regular.  Works in a Product manager.     Family History  Problem Relation Age of Onset  . Heart disease Mother     In her 87's  . Hypertension Mother   . Stroke Mother     CVA x 2, in her 78s  . Breast cancer Mother     DCIS  . Goiter Mother   . Lupus Paternal Aunt     Deceased    Outpatient Encounter Prescriptions as of 08/01/2016  Medication Sig  . amLODipine (NORVASC) 10 MG tablet Take 1 tablet (10 mg total) by mouth daily.  Marland Kitchen aspirin EC 81 MG tablet Take 81 mg by mouth daily.    . Calcium Carbonate-Vitamin D 500-125 MG-UNIT TABS Take by mouth 2 (two) times daily.    . ergocalciferol (VITAMIN D2) 50000 UNITS capsule   . hydroxychloroquine (PLAQUENIL) 200 MG tablet Take by mouth 2 (two) times daily.    Marland Kitchen  losartan-hydrochlorothiazide (HYZAAR) 100-25 MG tablet Take 1 tablet by mouth daily.  . Multiple Vitamin (MULTIVITAMIN) tablet Take 1 tablet by mouth daily.     No facility-administered encounter medications on file as of 08/01/2016.          Objective:   Physical Exam  Constitutional: She is oriented to person, place, and time. She appears well-developed and well-nourished.  HENT:  Head: Normocephalic and atraumatic.  Right Ear: External ear normal.  Left Ear: External ear normal.  Nose: Nose normal.  Mouth/Throat: Oropharynx is clear and moist.  TMs and canals are clear.   Eyes: Conjunctivae and EOM are normal. Pupils are equal, round, and reactive to light.  Neck: Neck supple. No thyromegaly present.  Cardiovascular: Normal rate, regular rhythm and normal heart sounds.   Pulmonary/Chest: Effort normal and breath sounds normal. She has no wheezes.  Lymphadenopathy:    She has no  cervical adenopathy.  Neurological: She is alert and oriented to person, place, and time.  Skin: Skin is warm and dry.  Psychiatric: She has a normal mood and affect.          Assessment & Plan:  Pharyngitis-exam is essentially normal today and check she does feel better. Likely viral. Gave reassurance.  Lupus-refer to Dr. Delphia Grates. Referral placed today.  HTN - Well controlled. Continue current regimen. Follow up in 6 months.  Not feeling well-did go ahead and repeat urinalysis. It was positive for leukocytes and blood stool sent for microscopic review as well as a urine culture.

## 2016-08-02 LAB — URINALYSIS, MICROSCOPIC ONLY
Casts: NONE SEEN [LPF]
YEAST: NONE SEEN [HPF]

## 2016-08-03 LAB — URINE CULTURE

## 2016-08-09 ENCOUNTER — Other Ambulatory Visit: Payer: Self-pay

## 2016-08-09 DIAGNOSIS — R82998 Other abnormal findings in urine: Secondary | ICD-10-CM

## 2016-08-10 ENCOUNTER — Ambulatory Visit: Payer: 59

## 2016-08-12 LAB — URINE CULTURE: ORGANISM ID, BACTERIA: NO GROWTH

## 2016-08-15 ENCOUNTER — Encounter: Payer: Self-pay | Admitting: Family Medicine

## 2016-08-15 LAB — CHLORIDE
ALBUMIN SERUM: 3.9
CHLORIDE, SERUM: 99
Calcium, Ser: 9.3
Carbon Dioxide, Total: 25
TOTAL PROTEIN: 7.5 g/dL

## 2016-09-12 ENCOUNTER — Encounter: Payer: Self-pay | Admitting: Family Medicine

## 2016-09-14 ENCOUNTER — Other Ambulatory Visit: Payer: Self-pay | Admitting: Family Medicine

## 2016-09-14 DIAGNOSIS — I10 Essential (primary) hypertension: Secondary | ICD-10-CM

## 2016-09-20 ENCOUNTER — Ambulatory Visit: Payer: 59 | Admitting: Family Medicine

## 2016-10-20 DIAGNOSIS — E559 Vitamin D deficiency, unspecified: Secondary | ICD-10-CM | POA: Insufficient documentation

## 2017-02-14 ENCOUNTER — Ambulatory Visit (INDEPENDENT_AMBULATORY_CARE_PROVIDER_SITE_OTHER): Payer: 59 | Admitting: Family Medicine

## 2017-02-14 ENCOUNTER — Encounter: Payer: Self-pay | Admitting: Family Medicine

## 2017-02-14 VITALS — BP 131/83 | HR 84 | Ht 64.0 in | Wt 189.0 lb

## 2017-02-14 DIAGNOSIS — Z0000 Encounter for general adult medical examination without abnormal findings: Secondary | ICD-10-CM | POA: Diagnosis not present

## 2017-02-14 DIAGNOSIS — Z23 Encounter for immunization: Secondary | ICD-10-CM

## 2017-02-14 DIAGNOSIS — E876 Hypokalemia: Secondary | ICD-10-CM | POA: Diagnosis not present

## 2017-02-14 NOTE — Patient Instructions (Addendum)

## 2017-02-14 NOTE — Addendum Note (Signed)
Addended by: Teddy Spike on: 02/14/2017 06:37 PM   Modules accepted: Orders

## 2017-02-14 NOTE — Progress Notes (Signed)
Subjective:     Anna Patel is a 46 y.o. female and is here for a comprehensive physical exam. The patient reports no problems.she did want to let me know that she did not have a period for almost 10 months and then just finally had one last month. Her to that she had regular menstrual cycles. She denies any recent changes.  Needs biometric screen completed for work.   Social History   Social History  . Marital status: Married    Spouse name: N/A  . Number of children: 3  . Years of education: N/A   Occupational History  . [employer]    Social History Main Topics  . Smoking status: Never Smoker  . Smokeless tobacco: Never Used  . Alcohol use No  . Drug use: No  . Sexual activity: Yes    Birth control/ protection: None     Comment: married, adopted a sons, 1 abortion, works a English as a second language teacher, does aroebics 3 X week, trying to lose weight.   Other Topics Concern  . Not on file   Social History Narrative   Some exercise but not regular.  Works in a Product manager.    Health Maintenance  Topic Date Due  . INFLUENZA VACCINE  12/28/2016  . PAP SMEAR  03/02/2020  . TETANUS/TDAP  07/20/2020  . HIV Screening  Completed    The following portions of the patient's history were reviewed and updated as appropriate: allergies, current medications, past family history, past medical history, past social history, past surgical history and problem list.  Review of Systems A comprehensive review of systems was negative.   Objective:    BP 131/83   Pulse 84   Ht 5\' 4"  (1.626 m)   Wt 189 lb (85.7 kg)   SpO2 99%   BMI 32.44 kg/m  General appearance: alert, cooperative and appears stated age Head: Normocephalic, without obvious abnormality, atraumatic Eyes: conj clear, EOMi, PEERLA Ears: conj clear, EOMI, PEERLA Nose: Nares normal. Septum midline. Mucosa normal. No drainage or sinus tenderness. Throat: lips, mucosa, and tongue normal; teeth and gums normal Neck: no  adenopathy, no carotid bruit, no JVD, supple, symmetrical, trachea midline and thyroid not enlarged, symmetric, no tenderness/mass/nodules Back: symmetric, no curvature. ROM normal. No CVA tenderness. Lungs: clear to auscultation bilaterally Breasts: normal appearance, no masses or tenderness Heart: regular rate and rhythm, S1, S2 normal, no murmur, click, rub or gallop Abdomen: soft, non-tender; bowel sounds normal; no masses,  no organomegaly Extremities: extremities normal, atraumatic, no cyanosis or edema Pulses: 2+ and symmetric Skin: Skin color, texture, turgor normal. No rashes or lesions Lymph nodes: Cervical, supraclavicular, and axillary nodes normal. Neurologic: Alert and oriented X 3, normal strength and tone. Normal symmetric reflexes. Normal coordination and gait    Assessment:    Healthy female exam.      Plan:     See After Visit Summary for Counseling Recommendations   Keep up a regular exercise program and make sure you are eating a healthy diet Try to eat 4 servings of dairy a day, or if you are lactose intolerant take a calcium with vitamin D daily.  Your vaccines are up to date.   Form completed for work.

## 2017-02-15 LAB — COMPLETE METABOLIC PANEL WITH GFR
AG Ratio: 1.1 (calc) (ref 1.0–2.5)
ALKALINE PHOSPHATASE (APISO): 84 U/L (ref 33–115)
ALT: 13 U/L (ref 6–29)
AST: 15 U/L (ref 10–30)
Albumin: 3.9 g/dL (ref 3.6–5.1)
BUN: 13 mg/dL (ref 7–25)
CO2: 31 mmol/L (ref 20–32)
Calcium: 9.3 mg/dL (ref 8.6–10.2)
Chloride: 102 mmol/L (ref 98–110)
Creat: 0.81 mg/dL (ref 0.50–1.10)
GFR, Est African American: 102 mL/min/{1.73_m2} (ref >=60)
GFR, Est Non African American: 88 mL/min/{1.73_m2} (ref >=60)
GLUCOSE: 88 mg/dL (ref 65–99)
Globulin: 3.7 g/dL (calc) (ref 1.9–3.7)
Potassium: 3 mmol/L — ABNORMAL LOW (ref 3.5–5.3)
SODIUM: 141 mmol/L (ref 135–146)
Total Bilirubin: 0.4 mg/dL (ref 0.2–1.2)
Total Protein: 7.6 g/dL (ref 6.1–8.1)

## 2017-02-15 LAB — TSH: TSH: 0.77 mIU/L

## 2017-02-15 LAB — LIPID PANEL W/REFLEX DIRECT LDL
CHOL/HDL RATIO: 3.7 (calc) (ref <5.0)
CHOLESTEROL: 176 mg/dL (ref <200)
HDL: 48 mg/dL — AB (ref >50)
LDL CHOLESTEROL (CALC): 105 mg/dL — AB
NON-HDL CHOLESTEROL (CALC): 128 mg/dL (ref <130)
TRIGLYCERIDES: 125 mg/dL (ref <150)

## 2017-02-15 LAB — HEMOGLOBIN A1C
Hgb A1c MFr Bld: 5.6 % of total Hgb (ref <5.7)
Mean Plasma Glucose: 114 (calc)
eAG (mmol/L): 6.3 (calc)

## 2017-02-16 ENCOUNTER — Telehealth: Payer: Self-pay | Admitting: *Deleted

## 2017-02-16 NOTE — Telephone Encounter (Signed)
Wellness form completed, faxed, confirmation received, copied and scanned.[name] [place]

## 2017-02-21 LAB — POTASSIUM: Potassium: 3.3 mmol/L — ABNORMAL LOW (ref 3.5–5.3)

## 2017-02-22 NOTE — Addendum Note (Signed)
Addended by: Teddy Spike on: 02/22/2017 09:15 AM   Modules accepted: Orders

## 2017-02-23 LAB — HM MAMMOGRAPHY

## 2017-03-17 ENCOUNTER — Encounter: Payer: Self-pay | Admitting: Family Medicine

## 2017-03-31 LAB — POTASSIUM: Potassium: 3.2 mmol/L — ABNORMAL LOW (ref 3.5–5.3)

## 2017-04-03 ENCOUNTER — Ambulatory Visit: Payer: 59 | Admitting: Family Medicine

## 2017-04-03 ENCOUNTER — Encounter: Payer: Self-pay | Admitting: Family Medicine

## 2017-04-03 VITALS — BP 158/62 | HR 78 | Ht 64.0 in | Wt 192.0 lb

## 2017-04-03 DIAGNOSIS — E876 Hypokalemia: Secondary | ICD-10-CM

## 2017-04-03 DIAGNOSIS — M545 Low back pain, unspecified: Secondary | ICD-10-CM

## 2017-04-03 MED ORDER — POTASSIUM CHLORIDE CRYS ER 20 MEQ PO TBCR: 20.0000 meq | EXTENDED_RELEASE_TABLET | Freq: Every day | ORAL | 1 refills | Status: DC

## 2017-04-03 MED ORDER — CYCLOBENZAPRINE HCL 10 MG PO TABS: 5.0000 mg | ORAL_TABLET | Freq: Every evening | ORAL | 0 refills | Status: DC | PRN

## 2017-04-03 NOTE — Patient Instructions (Signed)
Increase Motrin to 400 mg 3 x a day for the next 5 days.  Then can decrease dose.   Call if not better with home exercises over the next 2 weeks.

## 2017-04-03 NOTE — Progress Notes (Signed)
Subjective:    Patient ID: Anna Patel, female    DOB: [birth year], 45 y.o.   MRN: [id number]  HPI 46 year old female comes in today complaining of 1 week of low back pain.  [place] at the [place] field and lying in the grassy area on her back.  When she actually tried to get up she felt a strain in her right low back area.  It is gotten progressive across both sides of the low back over the last week.  She has been using ibuprofen and a heating pad she has been taking 200 mg once a day and says it does provide some temporary relief.  No numbness or tingling down the extremities no weakness of the lower legs.  No prior low back injuries.   Review of Systems   BP (!) 158/62   Pulse 78   Ht 5\' 4"  (1.626 m)   Wt 192 lb (87.1 kg)   SpO2 100%   BMI 32.96 kg/m     Allergies  Allergen Reactions  . Sulfa Antibiotics Nausea And Vomiting    Aggravates lupus symptoms  . Sulfonamide Derivatives     Sulfa drugs trigger patient's lupsus    Past Medical History:  Diagnosis Date  . Chronic steroid use   . JRA (juvenile rheumatoid arthritis) ([place])   . Preeclampsia    delivery 37 weeks  . SLE (systemic lupus erythematosus) ([place])     Past Surgical History:  Procedure Laterality Date  . TUBAL LIGATION      Social History   Socioeconomic History  . Marital status: Married    Spouse name: Not on file  . Number of children: 3  . Years of education: Not on file  . Highest education level: Not on file  Social Needs  . Financial resource strain: Not on file  . Food insecurity - worry: Not on file  . Food insecurity - inability: Not on file  . Transportation needs - medical: Not on file  . Transportation needs - non-medical: Not on file  Occupational History  . Occupation: [phone]    Employer: [employer]   Tobacco Use  . Smoking status: Never Smoker  . Smokeless tobacco: Never Used  Substance and Sexual Activity  . Alcohol use: No  . Drug use: No  . Sexual activity:  Yes    Birth control/protection: None    Comment: married, adopted a sons, 1 abortion, works a English as a second language teacher, does aroebics 3 X week, trying to lose weight.  Other Topics Concern  . Not on file  Social History Narrative   Some exercise but not regular.  Works in a Product manager.     Family History  Problem Relation Age of Onset  . Heart disease Mother        In her 53's  . Hypertension Mother   . Stroke Mother        CVA x 2, in her 52s  . Breast cancer Mother        DCIS  . Goiter Mother   . Lupus Paternal Aunt        Deceased    Outpatient Encounter Medications as of 04/03/2017  Medication Sig  . amLODipine (NORVASC) 10 MG tablet take 1 tablet by mouth once daily  . aspirin EC 81 MG tablet Take 81 mg by mouth daily.    . Calcium Carbonate-Vitamin D 500-125 MG-UNIT TABS Take by mouth 2 (two) times daily.    . hydroxychloroquine (PLAQUENIL) 200 MG  tablet Take by mouth 2 (two) times daily.    Marland Kitchen losartan-hydrochlorothiazide (HYZAAR) 100-25 MG tablet Take 1 tablet by mouth daily.  . Multiple Vitamin (MULTIVITAMIN) tablet Take 1 tablet by mouth daily.    . cyclobenzaprine (FLEXERIL) 10 MG tablet Take 0.5-1 tablets (5-10 mg total) at bedtime as needed by mouth for muscle spasms.  . potassium chloride SA (K-DUR,KLOR-CON) 20 MEQ tablet Take 1 tablet (20 mEq total) daily by mouth.   No facility-administered encounter medications on file as of 04/03/2017.           Objective:   Physical Exam  Constitutional: She is oriented to person, place, and time. She appears well-developed and well-nourished.  HENT:  Head: Normocephalic and atraumatic.  Eyes: Conjunctivae and EOM are normal.  Cardiovascular: Normal rate.  Pulmonary/Chest: Effort normal.  Musculoskeletal:  Pain with lumbar flexion but she is able to flex.  Normal extension.  Some pain with side bending particularly to the left.  Normal rotation right and left but again some discomfort.  Nontender over the lumbar spine.  Nontender  over the SI joints.  She is a little bit tender just over the hips bilaterally.  Negative straight leg raise bilaterally.  Hip, knee, ankle strength is 5 out of 5.  Patellar reflexes 2+.  Neurological: She is alert and oriented to person, place, and time.  Skin: Skin is dry. No pallor.  Psychiatric: She has a normal mood and affect. Her behavior is normal.  Vitals reviewed.      Assessment & Plan:  Low Back pain  -acute low back pain which is bilateral.  Think she may have injured a disc but not enough that is causing any nerve impingement but she definitely has a lot of stiffness and spasm.  Will add Flexeril in the evening and increase her Motrin that she is been taking to 40 mg 3 times a day.  Offered to refer her to more formal physical therapy but she wanted to try home PT first.  We gave her a handout on home exercises for the lumbar spine if she is not improving then will refer for more formal physical therapy.  Okay to go for x-ray if not improving over the next 2 weeks with conservative therapy.  Hypokalemia -repeat potassium was still low 3.3.  We will start her on potassium and then recheck in 1 week.  Still not clear on exactly why her potassium is low.  That she is always been a little bit borderline over the last year or 2.

## 2017-05-26 ENCOUNTER — Ambulatory Visit: Payer: 59 | Admitting: Family Medicine

## 2017-05-26 DIAGNOSIS — Z0189 Encounter for other specified special examinations: Secondary | ICD-10-CM

## 2017-05-26 NOTE — Progress Notes (Deleted)
   Subjective:    Patient ID: Anna Patel, female    DOB: [birth year], 46 y.o.   MRN: [id number]  HPI 46 year old female with a history of juvenile rheumatoid arthritis as well as lupus comes in today complaining of back pain.   Review of Systems     Objective:   Physical Exam        Assessment & Plan:

## 2017-07-21 ENCOUNTER — Other Ambulatory Visit: Payer: Self-pay | Admitting: Family Medicine

## 2017-10-09 ENCOUNTER — Other Ambulatory Visit: Payer: Self-pay | Admitting: Family Medicine

## 2017-10-09 DIAGNOSIS — I10 Essential (primary) hypertension: Secondary | ICD-10-CM

## 2018-02-14 ENCOUNTER — Ambulatory Visit (INDEPENDENT_AMBULATORY_CARE_PROVIDER_SITE_OTHER): Payer: Managed Care, Other (non HMO) | Admitting: Family Medicine

## 2018-02-14 ENCOUNTER — Encounter: Payer: Self-pay | Admitting: Family Medicine

## 2018-02-14 VITALS — BP 129/85 | HR 77 | Ht 64.0 in | Wt 188.0 lb

## 2018-02-14 DIAGNOSIS — Z0000 Encounter for general adult medical examination without abnormal findings: Secondary | ICD-10-CM

## 2018-02-14 DIAGNOSIS — Z23 Encounter for immunization: Secondary | ICD-10-CM

## 2018-02-14 DIAGNOSIS — I10 Essential (primary) hypertension: Secondary | ICD-10-CM | POA: Diagnosis not present

## 2018-02-14 NOTE — Patient Instructions (Signed)
Preventive Care 18-39 Years, Female Preventive care refers to lifestyle choices and visits with your health care provider that can promote health and wellness. What does preventive care include?  A yearly physical exam. This is also called an annual well check.  Dental exams once or twice a year.  Routine eye exams. Ask your health care provider how often you should have your eyes checked.  Personal lifestyle choices, including: ? Daily care of your teeth and gums. ? Regular physical activity. ? Eating a healthy diet. ? Avoiding tobacco and drug use. ? Limiting alcohol use. ? Practicing safe sex. ? Taking vitamin and mineral supplements as recommended by your health care provider. What happens during an annual well check? The services and screenings done by your health care provider during your annual well check will depend on your age, overall health, lifestyle risk factors, and family history of disease. Counseling Your health care provider may ask you questions about your:  Alcohol use.  Tobacco use.  Drug use.  Emotional well-being.  Home and relationship well-being.  Sexual activity.  Eating habits.  Work and work Statistician.  Method of birth control.  Menstrual cycle.  Pregnancy history.  Screening You may have the following tests or measurements:  Height, weight, and BMI.  Diabetes screening. This is done by checking your blood sugar (glucose) after you have not eaten for a while (fasting).  Blood pressure.  Lipid and cholesterol levels. These may be checked every 5 years starting at age 38.  Skin check.  Hepatitis C blood test.  Hepatitis B blood test.  Sexually transmitted disease (STD) testing.  BRCA-related cancer screening. This may be done if you have a family history of breast, ovarian, tubal, or peritoneal cancers.  Pelvic exam and Pap test. This may be done every 3 years starting at age 38. Starting at age 30, this may be done  every 5 years if you have a Pap test in combination with an HPV test.  Discuss your test results, treatment options, and if necessary, the need for more tests with your health care provider. Vaccines Your health care provider may recommend certain vaccines, such as:  Influenza vaccine. This is recommended every year.  Tetanus, diphtheria, and acellular pertussis (Tdap, Td) vaccine. You may need a Td booster every 10 years.  Varicella vaccine. You may need this if you have not been vaccinated.  HPV vaccine. If you are 39 or younger, you may need three doses over 6 months.  Measles, mumps, and rubella (MMR) vaccine. You may need at least one dose of MMR. You may also need a second dose.  Pneumococcal 13-valent conjugate (PCV13) vaccine. You may need this if you have certain conditions and were not previously vaccinated.  Pneumococcal polysaccharide (PPSV23) vaccine. You may need one or two doses if you smoke cigarettes or if you have certain conditions.  Meningococcal vaccine. One dose is recommended if you are age 68-21 years and a first-year college student living in a residence hall, or if you have one of several medical conditions. You may also need additional booster doses.  Hepatitis A vaccine. You may need this if you have certain conditions or if you travel or work in places where you may be exposed to hepatitis A.  Hepatitis B vaccine. You may need this if you have certain conditions or if you travel or work in places where you may be exposed to hepatitis B.  Haemophilus influenzae type b (Hib) vaccine. You may need this  if you have certain risk factors.  Talk to your health care provider about which screenings and vaccines you need and how often you need them. This information is not intended to replace advice given to you by your health care provider. Make sure you discuss any questions you have with your health care provider. Document Released: 07/12/2001 Document Revised:  02/03/2016 Document Reviewed: 03/17/2015 Elsevier Interactive Patient Education  2018 Elsevier Inc.  

## 2018-02-14 NOTE — Progress Notes (Signed)
Subjective:     Anna Patel is a 47 y.o. female and is here for a comprehensive physical exam. The patient reports no problems.  Social History   Socioeconomic History  . Marital status: Married    Spouse name: Not on file  . Number of children: 3  . Years of education: Not on file  . Highest education level: Not on file  Occupational History  . Occupation: [phone]    Employer: [employer]   Social Needs  . Financial resource strain: Not on file  . Food insecurity:    Worry: Not on file    Inability: Not on file  . Transportation needs:    Medical: Not on file    Non-medical: Not on file  Tobacco Use  . Smoking status: Never Smoker  . Smokeless tobacco: Never Used  Substance and Sexual Activity  . Alcohol use: No  . Drug use: No  . Sexual activity: Yes    Birth control/protection: None    Comment: married, adopted a sons, 1 abortion, works a English as a second language teacher, does aroebics 3 X week, trying to lose weight.  Lifestyle  . Physical activity:    Days per week: Not on file    Minutes per session: Not on file  . Stress: Not on file  Relationships  . Social connections:    Talks on phone: Not on file    Gets together: Not on file    Attends religious service: Not on file    Active member of club or organization: Not on file    Attends meetings of clubs or organizations: Not on file    Relationship status: Not on file  . Intimate partner violence:    Fear of current or ex partner: Not on file    Emotionally abused: Not on file    Physically abused: Not on file    Forced sexual activity: Not on file  Other Topics Concern  . Not on file  Social History Narrative   Some exercise but not regular.  Works in a Product manager.    Health Maintenance  Topic Date Due  . PAP SMEAR  03/02/2020  . TETANUS/TDAP  07/20/2020  . INFLUENZA VACCINE  Completed  . HIV Screening  Completed    The following portions of the patient's history were reviewed and updated as appropriate:  allergies, current medications, past family history, past medical history, past social history, past surgical history and problem list.  Review of Systems A comprehensive review of systems was negative.   Objective:    BP 129/85   Pulse 77   Ht 5\' 4"  (1.626 m)   Wt 188 lb (85.3 kg)   SpO2 100%   BMI 32.27 kg/m  General appearance: alert, cooperative and appears stated age Head: Normocephalic, without obvious abnormality, atraumatic Eyes: conj clear, EOMI, PEERLA Ears: normal TM's and external ear canals both ears Nose: Nares normal. Septum midline. Mucosa normal. No drainage or sinus tenderness. Throat: lips, mucosa, and tongue normal; teeth and gums normal Neck: no adenopathy, no carotid bruit, no JVD, supple, symmetrical, trachea midline and thyroid not enlarged, symmetric, no tenderness/mass/nodules Back: symmetric, no curvature. ROM normal. No CVA tenderness. Lungs: clear to auscultation bilaterally Breasts: normal appearance, no masses or tenderness Heart: regular rate and rhythm, S1, S2 normal, no murmur, click, rub or gallop Abdomen: soft, non-tender; bowel sounds normal; no masses,  no organomegaly Extremities: extremities normal, atraumatic, no cyanosis or edema Pulses: 2+ and symmetric Skin: Skin color, texture, turgor  normal. No rashes or lesions Lymph nodes: Cervical, supraclavicular, and axillary nodes normal. Neurologic: Alert and oriented X 3, normal strength and tone. Normal symmetric reflexes. Normal coordination and gait    Assessment:    Healthy female exam.     Plan:     See After Visit Summary for Counseling Recommendations   Keep up a regular exercise program and make sure you are eating a healthy diet Try to eat 4 servings of dairy a day, or if you are lactose intolerant take a calcium with vitamin D daily.  Your vaccines are up to date.

## 2018-02-15 LAB — LIPID PANEL
CHOLESTEROL: 183 mg/dL (ref <200)
HDL: 48 mg/dL — AB (ref >50)
LDL Cholesterol (Calc): 111 mg/dL (calc) — ABNORMAL HIGH
Non-HDL Cholesterol (Calc): 135 mg/dL (calc) — ABNORMAL HIGH (ref <130)
Total CHOL/HDL Ratio: 3.8 (calc) (ref <5.0)
Triglycerides: 129 mg/dL (ref <150)

## 2018-02-15 LAB — COMPLETE METABOLIC PANEL WITH GFR
AG RATIO: 1.1 (calc) (ref 1.0–2.5)
ALKALINE PHOSPHATASE (APISO): 84 U/L (ref 33–115)
ALT: 14 U/L (ref 6–29)
AST: 16 U/L (ref 10–35)
Albumin: 4.1 g/dL (ref 3.6–5.1)
BUN: 13 mg/dL (ref 7–25)
CO2: 28 mmol/L (ref 20–32)
Calcium: 9.9 mg/dL (ref 8.6–10.2)
Chloride: 101 mmol/L (ref 98–110)
Creat: 0.86 mg/dL (ref 0.50–1.10)
GFR, Est African American: 95 mL/min/{1.73_m2} (ref >=60)
GFR, Est Non African American: 82 mL/min/{1.73_m2} (ref >=60)
Globulin: 3.6 g/dL (calc) (ref 1.9–3.7)
Glucose, Bld: 92 mg/dL (ref 65–99)
POTASSIUM: 3.1 mmol/L — AB (ref 3.5–5.3)
Sodium: 141 mmol/L (ref 135–146)
Total Bilirubin: 0.7 mg/dL (ref 0.2–1.2)
Total Protein: 7.7 g/dL (ref 6.1–8.1)

## 2018-02-15 LAB — HEMOGLOBIN A1C
HEMOGLOBIN A1C: 5.8 %{Hb} — AB (ref <5.7)
Mean Plasma Glucose: 120 (calc)
eAG (mmol/L): 6.6 (calc)

## 2018-02-16 ENCOUNTER — Other Ambulatory Visit: Payer: Self-pay | Admitting: Family Medicine

## 2018-02-16 MED ORDER — POTASSIUM CHLORIDE CRYS ER 10 MEQ PO TBCR: 10.0000 meq | EXTENDED_RELEASE_TABLET | Freq: Every day | ORAL | 3 refills | Status: DC

## 2018-02-19 ENCOUNTER — Other Ambulatory Visit: Payer: Self-pay | Admitting: Family Medicine

## 2018-02-19 ENCOUNTER — Other Ambulatory Visit: Payer: Self-pay

## 2018-02-19 DIAGNOSIS — E876 Hypokalemia: Secondary | ICD-10-CM

## 2018-02-19 NOTE — Progress Notes (Signed)
Per labs on 02-14-18:  "I sent over a smaller potassium tab so she can try to take it daily plan to recheck potassium in 1 mo."

## 2018-06-03 ENCOUNTER — Encounter: Payer: Self-pay | Admitting: Emergency Medicine

## 2018-06-03 ENCOUNTER — Other Ambulatory Visit: Payer: Self-pay

## 2018-06-03 ENCOUNTER — Emergency Department (INDEPENDENT_AMBULATORY_CARE_PROVIDER_SITE_OTHER)
Admission: EM | Admit: 2018-06-03 | Discharge: 2018-06-03 | Disposition: A | Discharge status: Home / Self Care | Payer: Managed Care, Other (non HMO) | Source: Home / Self Care | Attending: Internal Medicine | Admitting: Internal Medicine

## 2018-06-03 DIAGNOSIS — J020 Streptococcal pharyngitis: Secondary | ICD-10-CM | POA: Diagnosis not present

## 2018-06-03 LAB — POCT RAPID STREP A (OFFICE): Rapid Strep A Screen: POSITIVE — AB

## 2018-06-03 MED ORDER — PENICILLIN V POTASSIUM 500 MG PO TABS: 500.0000 mg | ORAL_TABLET | Freq: Three times a day (TID) | ORAL | 0 refills | Status: DC

## 2018-06-03 NOTE — ED Provider Notes (Signed)
[name]    CSN: [id number] Arrival date & time: 06/03/18  1229     History   Chief Complaint Chief Complaint  Patient presents with  . Sore Throat    HPI Anna Patel is a 48 y.o. female.   48 yo female with past medical history sig for SLE presents to urgent care complaining of sore throat x3 days.  She denies fever or rash.       Past Medical History:  Diagnosis Date  . Chronic steroid use   . JRA (juvenile rheumatoid arthritis) ([place])   . Preeclampsia    delivery 37 weeks  . SLE (systemic lupus erythematosus) ([place])     Patient Active Problem List   Diagnosis Date Noted  . JRA (juvenile rheumatoid arthritis) ([place]) 12/16/2011  . PROTEINURIA 01/06/2009  . ESSENTIAL HYPERTENSION, BENIGN 12/15/2008  . DERMATITIS, ATOPIC 02/27/2007  . OBESITY, NOS 03/07/2006  . SLE 03/07/2006    Past Surgical History:  Procedure Laterality Date  . TUBAL LIGATION      OB History    Gravida  1   Para  1   Term      Preterm      AB      Living  1     SAB      TAB      Ectopic      Multiple      Live Births               Home Medications    Prior to Admission medications   Medication Sig Start Date End Date Taking? Authorizing Provider  amLODipine (NORVASC) 10 MG tablet TAKE 1 TABLET BY MOUTH ONCE DAILY 10/10/17   Hali Marry, MD  aspirin EC 81 MG tablet Take 81 mg by mouth daily.      [provider]  Calcium Carbonate-Vitamin D 500-125 MG-UNIT TABS Take by mouth 2 (two) times daily.      [provider]  cyclobenzaprine (FLEXERIL) 10 MG tablet Take 0.5-1 tablets (5-10 mg total) at bedtime as needed by mouth for muscle spasms. 04/03/17   Hali Marry, MD  hydroxychloroquine (PLAQUENIL) 200 MG tablet Take by mouth 2 (two) times daily.      [provider]  losartan-hydrochlorothiazide (HYZAAR) 100-25 MG tablet TAKE 1 TABLET BY MOUTH ONCE DAILY 07/24/17   Hali Marry, MD  Multiple  Vitamin (MULTIVITAMIN) tablet Take 1 tablet by mouth daily.      [provider]  penicillin v potassium (VEETID) 500 MG tablet Take 1 tablet (500 mg total) by mouth 3 (three) times daily. 06/03/18   Harrie Foreman, MD  potassium chloride SA (K-DUR,KLOR-CON) 10 MEQ tablet Take 1 tablet (10 mEq total) by mouth daily. 02/16/18 05/17/18  Hali Marry, MD    Family History Family History  Problem Relation Age of Onset  . Heart disease Mother        In her 65's  . Hypertension Mother   . Stroke Mother        CVA x 2, in her 54s  . Breast cancer Mother        DCIS  . Goiter Mother   . Lupus Paternal Aunt        Deceased    Social History Social History   Tobacco Use  . Smoking status: Never Smoker  . Smokeless tobacco: Never Used  Substance Use Topics  . Alcohol use: No  . Drug use: No  Allergies   Sulfa antibiotics and Sulfonamide derivatives   Review of Systems Review of Systems  Constitutional: Negative for chills and fever.  HENT: Positive for sore throat. Negative for tinnitus.   Eyes: Negative for redness.  Respiratory: Negative for cough and shortness of breath.   Cardiovascular: Negative for chest pain and palpitations.  Gastrointestinal: Negative for abdominal pain, diarrhea, nausea and vomiting.  Genitourinary: Negative for dysuria, frequency and urgency.  Musculoskeletal: Negative for myalgias.  Skin: Negative for rash.       No lesions  Neurological: Negative for weakness.  Hematological: Does not bruise/bleed easily.  Psychiatric/Behavioral: Negative for suicidal ideas.     Physical Exam Triage Vital Signs ED Triage Vitals [06/03/18 1251]  Enc Vitals Group     BP 134/87     Pulse Rate 91     Resp 18     Temp 99.1 F (37.3 C)     Temp Source Oral     SpO2 98 %     Weight 188 lb (85.3 kg)     Height 5\' 4"  (1.626 m)     Head Circumference      Peak Flow      Pain Score 3     Pain Loc      Pain Edu?      Excl. in [place]?     No data found.  Updated Vital Signs BP 134/87 (BP Location: Right Arm)   Pulse 91   Temp 99.1 F (37.3 C) (Oral)   Resp 18   Ht 5\' 4"  (1.626 m)   Wt 85.3 kg   SpO2 98%   BMI 32.27 kg/m   Visual Acuity Right Eye Distance:   Left Eye Distance:   Bilateral Distance:    Right Eye Near:   Left Eye Near:    Bilateral Near:     Physical Exam Vitals signs and nursing note reviewed.  Constitutional:      General: She is not in acute distress.    Appearance: She is well-developed.  HENT:     Head: Normocephalic and atraumatic.     Mouth/Throat:     Pharynx: Posterior oropharyngeal erythema present.  Eyes:     General: No scleral icterus.    Conjunctiva/sclera: Conjunctivae normal.     Pupils: Pupils are equal, round, and reactive to light.  Neck:     Musculoskeletal: Normal range of motion and neck supple.     Thyroid: No thyromegaly.     Vascular: No JVD.     Trachea: No tracheal deviation.  Cardiovascular:     Rate and Rhythm: Normal rate and regular rhythm.     Heart sounds: Normal heart sounds. No murmur. No friction rub. No gallop.   Pulmonary:     Effort: Pulmonary effort is normal.     Breath sounds: Normal breath sounds.  Abdominal:     General: Bowel sounds are normal. There is no distension.     Palpations: Abdomen is soft.     Tenderness: There is no abdominal tenderness.  Musculoskeletal: Normal range of motion.  Lymphadenopathy:     Cervical: No cervical adenopathy.  Skin:    General: Skin is warm and dry.  Neurological:     Mental Status: She is alert and oriented to person, place, and time.     Cranial Nerves: No cranial nerve deficit.  Psychiatric:        Behavior: Behavior normal.        Thought Content: Thought content normal.  Judgment: Judgment normal.      UC Treatments / Results  Labs (all labs ordered are listed, but only abnormal results are displayed) Labs Reviewed  POCT RAPID STREP A (OFFICE) - Abnormal; Notable for the  following components:      Result Value   Rapid Strep A Screen Positive (*)    All other components within normal limits    EKG None  Radiology No results found.  Procedures Procedures (including critical care time)  Medications Ordered in UC Medications - No data to display  Initial Impression / Assessment and Plan / UC Course  I have reviewed the triage vital signs and the nursing notes.  Pertinent labs & imaging results that were available during my care of the patient were reviewed by me and considered in my medical decision making (see chart for details).     Positive Strep A.  Rx PCN  Final Clinical Impressions(s) / UC Diagnoses   Final diagnoses:  Strep throat   Discharge Instructions   None    ED Prescriptions    Medication Sig Dispense Auth. Provider   penicillin v potassium (VEETID) 500 MG tablet Take 1 tablet (500 mg total) by mouth 3 (three) times daily. 30 tablet Harrie Foreman, MD     Controlled Substance Prescriptions [place] Controlled Substance Registry consulted? Not Applicable   Harrie Foreman, MD 06/03/18 1339

## 2018-06-03 NOTE — ED Triage Notes (Signed)
Patient reports 3 days of sore throat with pain severe enough that it is hard to swallow; no known fever; taking ibuprophen; son had Strep throat 2 weeks ago.

## 2018-07-26 ENCOUNTER — Encounter: Payer: Self-pay | Admitting: Family Medicine

## 2018-07-26 ENCOUNTER — Ambulatory Visit (INDEPENDENT_AMBULATORY_CARE_PROVIDER_SITE_OTHER): Payer: Managed Care, Other (non HMO) | Admitting: Family Medicine

## 2018-07-26 VITALS — BP 129/84 | HR 85 | Ht 64.0 in | Wt 182.0 lb

## 2018-07-26 DIAGNOSIS — J020 Streptococcal pharyngitis: Secondary | ICD-10-CM

## 2018-07-26 MED ORDER — PENICILLIN V POTASSIUM 500 MG PO TABS: 500.0000 mg | ORAL_TABLET | Freq: Two times a day (BID) | ORAL | 0 refills | Status: AC

## 2018-07-26 NOTE — Patient Instructions (Signed)
Pharyngitis  Pharyngitis is a sore throat (pharynx). This is when there is redness, pain, and swelling in your throat. Most of the time, this condition gets better on its own. In some cases, you may need medicine. Follow these instructions at home:  Take over-the-counter and prescription medicines only as told by your doctor. ? If you were prescribed an antibiotic medicine, take it as told by your doctor. Do not stop taking the antibiotic even if you start to feel better. ? Do not give children aspirin. Aspirin has been linked to Reye syndrome.  Drink enough water and fluids to keep your pee (urine) clear or pale yellow.  Get a lot of rest.  Rinse your mouth (gargle) with a salt-water mixture 3-4 times a day or as needed. To make a salt-water mixture, completely dissolve -1 tsp of salt in 1 cup of warm water.  If your doctor approves, you may use throat lozenges or sprays to soothe your throat. Contact a doctor if:  You have large, tender lumps in your neck.  You have a rash.  You cough up green, yellow-brown, or bloody spit. Get help right away if:  You have a stiff neck.  You drool or cannot swallow liquids.  You cannot drink or take medicines without throwing up.  You have very bad pain that does not go away with medicine.  You have problems breathing, and it is not from a stuffy nose.  You have new pain and swelling in your knees, ankles, wrists, or elbows. Summary  Pharyngitis is a sore throat (pharynx). This is when there is redness, pain, and swelling in your throat.  If you were prescribed an antibiotic medicine, take it as told by your doctor. Do not stop taking the antibiotic even if you start to feel better.  Most of the time, pharyngitis gets better on its own. Sometimes, you may need medicine. This information is not intended to replace advice given to you by your health care provider. Make sure you discuss any questions you have with your health care  provider. Document Released: 11/02/2007 Document Revised: 06/21/2016 Document Reviewed: 06/21/2016 Elsevier Interactive Patient Education  2019 Elsevier Inc.  

## 2018-07-26 NOTE — Progress Notes (Signed)
Acute Office Visit  Subjective:    Patient ID: Anna Patel, female    DOB: 06/24/1970, 47 y.o.   MRN: [id number]  Chief Complaint  Patient presents with  . Sore Throat    3days, some sinus pressure, denies f/s/c, taking robintussin    HPI Patient is in today for ST x 3 days, some sinus pressure. Painufl to swallow.  Denies Fever, sweats or chills.  Had strep throat in January. Had a few tabs left over and took those yesterday.  Taking Robitussion as well.  .  No GI sxs. Son tested positive yesterday.    Past Medical History:  Diagnosis Date  . Chronic steroid use   . JRA (juvenile rheumatoid arthritis) ([place])   . Preeclampsia    delivery 37 weeks  . SLE (systemic lupus erythematosus) ([place])     Past Surgical History:  Procedure Laterality Date  . TUBAL LIGATION      Family History  Problem Relation Age of Onset  . Heart disease Mother        In her 37's  . Hypertension Mother   . Stroke Mother        CVA x 2, in her 42s  . Breast cancer Mother        DCIS  . Goiter Mother   . Lupus Paternal Aunt        Deceased    Social History   Socioeconomic History  . Marital status: Married    Spouse name: Not on file  . Number of children: 3  . Years of education: Not on file  . Highest education level: Not on file  Occupational History  . Occupation: [phone]    Employer: [employer]   Social Needs  . Financial resource strain: Not on file  . Food insecurity:    Worry: Not on file    Inability: Not on file  . Transportation needs:    Medical: Not on file    Non-medical: Not on file  Tobacco Use  . Smoking status: Never Smoker  . Smokeless tobacco: Never Used  Substance and Sexual Activity  . Alcohol use: No  . Drug use: No  . Sexual activity: Yes    Birth control/protection: None    Comment: married, adopted a sons, 1 abortion, works a English as a second language teacher, does aroebics 3 X week, trying to lose weight.  Lifestyle  . Physical activity:    Days per week:  Not on file    Minutes per session: Not on file  . Stress: Not on file  Relationships  . Social connections:    Talks on phone: Not on file    Gets together: Not on file    Attends religious service: Not on file    Active member of club or organization: Not on file    Attends meetings of clubs or organizations: Not on file    Relationship status: Not on file  . Intimate partner violence:    Fear of current or ex partner: Not on file    Emotionally abused: Not on file    Physically abused: Not on file    Forced sexual activity: Not on file  Other Topics Concern  . Not on file  Social History Narrative   Some exercise but not regular.  Works in a Product manager.     Outpatient Medications Prior to Visit  Medication Sig Dispense Refill  . amLODipine (NORVASC) 10 MG tablet TAKE 1 TABLET BY MOUTH ONCE DAILY 60 tablet  3  . aspirin EC 81 MG tablet Take 81 mg by mouth daily.      . Calcium Carbonate-Vitamin D 500-125 MG-UNIT TABS Take by mouth 2 (two) times daily.      . cyclobenzaprine (FLEXERIL) 10 MG tablet Take 0.5-1 tablets (5-10 mg total) at bedtime as needed by mouth for muscle spasms. 30 tablet 0  . hydroxychloroquine (PLAQUENIL) 200 MG tablet Take by mouth 2 (two) times daily.      Marland Kitchen losartan-hydrochlorothiazide (HYZAAR) 100-25 MG tablet TAKE 1 TABLET BY MOUTH ONCE DAILY 90 tablet 1  . Multiple Vitamin (MULTIVITAMIN) tablet Take 1 tablet by mouth daily.      . potassium chloride SA (K-DUR,KLOR-CON) 10 MEQ tablet Take 1 tablet (10 mEq total) by mouth daily. 90 tablet 3  . penicillin v potassium (VEETID) 500 MG tablet Take 1 tablet (500 mg total) by mouth 3 (three) times daily. 30 tablet 0   No facility-administered medications prior to visit.     Allergies  Allergen Reactions  . Sulfa Antibiotics Nausea And Vomiting    Aggravates lupus symptoms  . Sulfonamide Derivatives     Sulfa drugs trigger patient's lupsus    ROS     Objective:    Physical Exam  BP 129/84    Pulse 85   Ht 5\' 4"  (1.626 m)   Wt 182 lb (82.6 kg)   SpO2 98%   BMI 31.24 kg/m  Wt Readings from Last 3 Encounters:  07/26/18 182 lb (82.6 kg)  06/03/18 188 lb (85.3 kg)  02/14/18 188 lb (85.3 kg)    There are no preventive care reminders to display for this patient.  There are no preventive care reminders to display for this patient.   Lab Results  Component Value Date   TSH 0.77 02/14/2017   Lab Results  Component Value Date   WBC 5.6 09/11/2015   WBC 5.6 09/11/2015   HGB 14.7 09/11/2015   HGB 14.7 09/11/2015   HCT 44 09/11/2015   MCV 81.5 09/15/2014   PLT 223 09/11/2015   PLT 223 09/11/2015   Lab Results  Component Value Date   NA 141 02/14/2018   K 3.1 (L) 02/14/2018   CO2 28 02/14/2018   GLUCOSE 92 02/14/2018   BUN 13 02/14/2018   CREATININE 0.86 02/14/2018   BILITOT 0.7 02/14/2018   ALKPHOS 73 03/31/2016   AST 16 02/14/2018   ALT 14 02/14/2018   PROT 7.7 02/14/2018   ALBUMIN 3.6 03/31/2016   CALCIUM 9.9 02/14/2018   Lab Results  Component Value Date   CHOL 183 02/14/2018   Lab Results  Component Value Date   HDL 48 (L) 02/14/2018   Lab Results  Component Value Date   LDLCALC 111 (H) 02/14/2018   Lab Results  Component Value Date   TRIG 129 02/14/2018   Lab Results  Component Value Date   CHOLHDL 3.8 02/14/2018   Lab Results  Component Value Date   HGBA1C 5.8 (H) 02/14/2018       Assessment & Plan:   Problem List Items Addressed This Visit    None    Visit Diagnoses    Strep pharyngitis    -  Primary     Pharyngitis-strep throat versus viral illness.  She is Artie started taking some leftover penicillin and does have a positive exposure within the home so we will go ahead and treat with penicillin for 10 days.  Call if not better in 1 week.  Recommend routine hygiene.  Getting a new toothbrush within 48 hours etc. Salt water gargles.     Meds ordered this encounter  Medications  . penicillin v potassium (VEETID) 500 MG  tablet    Sig: Take 1 tablet (500 mg total) by mouth 2 (two) times daily for 10 days.    Dispense:  20 tablet    Refill:  0     Beatrice Lecher, MD

## 2018-07-28 ENCOUNTER — Other Ambulatory Visit: Payer: Self-pay | Admitting: Family Medicine

## 2018-09-04 ENCOUNTER — Telehealth: Payer: Self-pay | Admitting: Physician Assistant

## 2018-09-04 ENCOUNTER — Ambulatory Visit (INDEPENDENT_AMBULATORY_CARE_PROVIDER_SITE_OTHER): Payer: Managed Care, Other (non HMO)

## 2018-09-04 ENCOUNTER — Other Ambulatory Visit: Payer: Self-pay

## 2018-09-04 ENCOUNTER — Encounter: Payer: Self-pay | Admitting: Sports Medicine

## 2018-09-04 ENCOUNTER — Ambulatory Visit (INDEPENDENT_AMBULATORY_CARE_PROVIDER_SITE_OTHER): Payer: Managed Care, Other (non HMO) | Admitting: Sports Medicine

## 2018-09-04 DIAGNOSIS — E876 Hypokalemia: Secondary | ICD-10-CM | POA: Diagnosis not present

## 2018-09-04 DIAGNOSIS — R0789 Other chest pain: Secondary | ICD-10-CM

## 2018-09-04 LAB — COMPREHENSIVE METABOLIC PANEL
AG Ratio: 1.1 (calc) (ref 1.0–2.5)
ALT: 13 U/L (ref 6–29)
AST: 16 U/L (ref 10–35)
Albumin: 4 g/dL (ref 3.6–5.1)
Alkaline phosphatase (APISO): 81 U/L (ref 31–125)
BUN: 18 mg/dL (ref 7–25)
CO2: 33 mmol/L — ABNORMAL HIGH (ref 20–32)
Calcium: 9.5 mg/dL (ref 8.6–10.2)
Chloride: 102 mmol/L (ref 98–110)
Creat: 0.87 mg/dL (ref 0.50–1.10)
Globulin: 3.6 g/dL (calc) (ref 1.9–3.7)
Glucose, Bld: 87 mg/dL (ref 65–99)
Potassium: 3.3 mmol/L — ABNORMAL LOW (ref 3.5–5.3)
Sodium: 141 mmol/L (ref 135–146)
Total Bilirubin: 0.3 mg/dL (ref 0.2–1.2)
Total Protein: 7.6 g/dL (ref 6.1–8.1)

## 2018-09-04 LAB — CBC
HCT: 42.6 % (ref 35.0–45.0)
Hemoglobin: 14.2 g/dL (ref 11.7–15.5)
MCH: 26.8 pg — ABNORMAL LOW (ref 27.0–33.0)
MCHC: 33.3 g/dL (ref 32.0–36.0)
MCV: 80.5 fL (ref 80.0–100.0)
MPV: 12.3 fL (ref 7.5–12.5)
Platelets: 228 10*3/uL (ref 140–400)
RBC: 5.29 10*6/uL — ABNORMAL HIGH (ref 3.80–5.10)
RDW: 13.4 % (ref 11.0–15.0)
WBC: 8.3 10*3/uL (ref 3.8–10.8)

## 2018-09-04 LAB — CK TOTAL AND CKMB (NOT AT ARMC)
CK, MB: 0.9 ng/mL (ref 0–5.0)
Relative Index: 1.1 (ref 0–4.0)
Total CK: 84 U/L (ref 29–143)

## 2018-09-04 LAB — BRAIN NATRIURETIC PEPTIDE: Brain Natriuretic Peptide: <4 pg/mL (ref <100)

## 2018-09-04 LAB — TSH: TSH: 0.66 mIU/L

## 2018-09-04 LAB — D-DIMER, QUANTITATIVE: D-Dimer, Quant: 0.32 mcg/mL FEU (ref <0.50)

## 2018-09-04 LAB — TROPONIN I: Troponin I: <0.01 ng/mL (ref <=0.0)

## 2018-09-04 NOTE — Assessment & Plan Note (Signed)
Uncontrolled but has not taken her medication today, also had dietary indiscretions today. She will take her medicine regularly, and let me know what the blood pressures look like tomorrow morning.

## 2018-09-04 NOTE — Assessment & Plan Note (Signed)
Doubt this is cardiac, there is likely component of anxiety. Bedside ultrasound shows no evidence of pericardial effusion, good ejection fraction. ECG completely unremarkable, she is a bit isoelectric in aVF suggesting some degree of left axis deviation. Adding a chest x-ray, CBC, CMP, d-dimer, in PE, cardiac enzymes, TSH.

## 2018-09-04 NOTE — Progress Notes (Addendum)
Subjective:    CC: Chest tightness  HPI: This is a pleasant 48 year old female, for a week or 2 now she has had a vague tightness in her chest, it is nonexertional, she is walked up several flights of stairs without any exacerbation or chest tightness.  She feels as though she has trouble getting a deep breath.  Symptoms are mild, persistent.  No fevers, chills, headaches, visual changes.  She does have a history of SLE, she also did not take her blood pressure medication today.  Her main concern was whether or not she has a pericardial effusion.  I reviewed the past medical history, family history, social history, surgical history, and allergies today and no changes were needed.  Please see the problem list section below in epic for further details.  Past Medical History: Past Medical History:  Diagnosis Date  . Chronic steroid use   . JRA (juvenile rheumatoid arthritis) ([place])   . Preeclampsia    delivery 37 weeks  . SLE (systemic lupus erythematosus) ([place])    Past Surgical History: Past Surgical History:  Procedure Laterality Date  . TUBAL LIGATION     Social History: Social History   Socioeconomic History  . Marital status: Married    Spouse name: Not on file  . Number of children: 3  . Years of education: Not on file  . Highest education level: Not on file  Occupational History  . Occupation: [phone]    Employer: [employer]   Social Needs  . Financial resource strain: Not on file  . Food insecurity:    Worry: Not on file    Inability: Not on file  . Transportation needs:    Medical: Not on file    Non-medical: Not on file  Tobacco Use  . Smoking status: Never Smoker  . Smokeless tobacco: Never Used  Substance and Sexual Activity  . Alcohol use: No  . Drug use: No  . Sexual activity: Yes    Birth control/protection: None    Comment: married, adopted a sons, 1 abortion, works a English as a second language teacher, does aroebics 3 X week, trying to lose weight.  Lifestyle  .  Physical activity:    Days per week: Not on file    Minutes per session: Not on file  . Stress: Not on file  Relationships  . Social connections:    Talks on phone: Not on file    Gets together: Not on file    Attends religious service: Not on file    Active member of club or organization: Not on file    Attends meetings of clubs or organizations: Not on file    Relationship status: Not on file  Other Topics Concern  . Not on file  Social History Narrative   Some exercise but not regular.  Works in a Product manager.    Family History: Family History  Problem Relation Age of Onset  . Heart disease Mother        In her 76's  . Hypertension Mother   . Stroke Mother        CVA x 2, in her 64s  . Breast cancer Mother        DCIS  . Goiter Mother   . Lupus Paternal Aunt        Deceased   Allergies: Allergies  Allergen Reactions  . Sulfa Antibiotics Nausea And Vomiting    Aggravates lupus symptoms  . Sulfonamide Derivatives     Sulfa drugs trigger patient's lupsus  Medications: See med rec.  Review of Systems: No fevers, chills, night sweats, weight loss, chest pain, or shortness of breath.   Objective:    General: Well Developed, well nourished, and in no acute distress.  Neuro: Alert and oriented x3, extra-ocular muscles intact, sensation grossly intact.  HEENT: Normocephalic, atraumatic, pupils equal round reactive to light, neck supple, no masses, no lymphadenopathy, thyroid nonpalpable.  Skin: Warm and dry, no rashes. Cardiac: Regular rate and rhythm, no murmurs rubs or gallops, no lower extremity edema.  Respiratory: Clear to auscultation bilaterally. Not using accessory muscles, speaking in full sentences.  Twelve-lead ECG personally reviewed, normal rate, normal rhythm, isoelectric in aVF suggesting left axis deviation, likely due to hypertension, no PR or ST changes.  Bedside ultrasound performed today, no obvious pericardial effusion, good ejection fraction.   Impression and Recommendations:    Chest tightness Doubt this is cardiac, there is likely component of anxiety. Bedside ultrasound shows no evidence of pericardial effusion, good ejection fraction. ECG completely unremarkable, she is a bit isoelectric in aVF suggesting some degree of left axis deviation. Adding a chest x-ray, CBC, CMP, d-dimer, in PE, cardiac enzymes, TSH.   ESSENTIAL HYPERTENSION, BENIGN Uncontrolled but has not taken her medication today, also had dietary indiscretions today. She will take her medicine regularly, and let me know what the blood pressures look like tomorrow morning.  Hypokalemia Uncontrolled blood pressure with mild hypokalemia.   Lets add Spironolactone 25 mg for a week and then recheck blood pressure and potassium.  This will help blood pressure as well.    ___________________________________________ Gwen Her. Dianah Field, M.D., ABFM., CAQSM. Primary Care and Sports Medicine [facility]  Adjunct Professor of [employer]

## 2018-09-04 NOTE — Telephone Encounter (Signed)
FYI:  Called by on call nurse team with labs. Potassium a hair low. Troponin and d-dimer normal. Provider who saw patient in clinic will address tomorrow.

## 2018-09-05 DIAGNOSIS — E876 Hypokalemia: Secondary | ICD-10-CM | POA: Insufficient documentation

## 2018-09-05 MED ORDER — SPIRONOLACTONE 25 MG PO TABS: ORAL_TABLET | ORAL | 3 refills | Status: DC

## 2018-09-05 NOTE — Assessment & Plan Note (Signed)
Uncontrolled blood pressure with mild hypokalemia.   Lets add Spironolactone 25 mg for a week and then recheck blood pressure and potassium.  This will help blood pressure as well.

## 2018-09-05 NOTE — Telephone Encounter (Signed)
Noted, considering uncontrolled blood pressureI have added spironolactone, we will do this for a week and then recheck her potassium.  I have already sent a message to [employer].

## 2018-09-05 NOTE — Addendum Note (Signed)
Addended by: Silverio Decamp on: 09/05/2018 09:21 AM   Modules accepted: Orders

## 2018-11-20 ENCOUNTER — Other Ambulatory Visit: Payer: Self-pay | Admitting: Family Medicine

## 2018-11-20 DIAGNOSIS — I10 Essential (primary) hypertension: Secondary | ICD-10-CM

## 2018-12-14 ENCOUNTER — Other Ambulatory Visit: Payer: Self-pay | Admitting: Family Medicine

## 2018-12-26 ENCOUNTER — Other Ambulatory Visit: Payer: Self-pay | Admitting: Family Medicine

## 2018-12-26 DIAGNOSIS — I10 Essential (primary) hypertension: Secondary | ICD-10-CM

## 2019-01-17 ENCOUNTER — Other Ambulatory Visit: Payer: Self-pay | Admitting: Family Medicine

## 2019-02-01 ENCOUNTER — Other Ambulatory Visit: Payer: Self-pay | Admitting: Family Medicine

## 2019-02-01 DIAGNOSIS — I10 Essential (primary) hypertension: Secondary | ICD-10-CM

## 2019-02-18 ENCOUNTER — Other Ambulatory Visit: Payer: Self-pay

## 2019-02-18 ENCOUNTER — Encounter: Payer: Self-pay | Admitting: Family Medicine

## 2019-02-18 ENCOUNTER — Ambulatory Visit (INDEPENDENT_AMBULATORY_CARE_PROVIDER_SITE_OTHER): Payer: Managed Care, Other (non HMO) | Admitting: Family Medicine

## 2019-02-18 VITALS — BP 129/87 | HR 77 | Ht 64.0 in | Wt 193.0 lb

## 2019-02-18 DIAGNOSIS — Z0000 Encounter for general adult medical examination without abnormal findings: Secondary | ICD-10-CM | POA: Diagnosis not present

## 2019-02-18 DIAGNOSIS — Z23 Encounter for immunization: Secondary | ICD-10-CM | POA: Diagnosis not present

## 2019-02-18 DIAGNOSIS — R300 Dysuria: Secondary | ICD-10-CM

## 2019-02-18 DIAGNOSIS — I10 Essential (primary) hypertension: Secondary | ICD-10-CM | POA: Diagnosis not present

## 2019-02-18 MED ORDER — POTASSIUM CHLORIDE CRYS ER 10 MEQ PO TBCR: 10.0000 meq | EXTENDED_RELEASE_TABLET | Freq: Every day | ORAL | 3 refills | Status: DC

## 2019-02-18 MED ORDER — AMLODIPINE BESYLATE 10 MG PO TABS: ORAL_TABLET | ORAL | 1 refills | Status: DC

## 2019-02-18 MED ORDER — LOSARTAN POTASSIUM-HCTZ 100-25 MG PO TABS: 1.0000 | ORAL_TABLET | Freq: Every day | ORAL | 1 refills | Status: DC

## 2019-02-18 NOTE — Progress Notes (Signed)
Subjective:     Anna Patel is a 48 y.o. female and is here for a comprehensive physical exam. The patient reports no problems.she is doing well.  She has been staying active.    She also complains of burning with urination on and off for a couple of weeks.  She says she has not had any significant urgency.  No fevers chills or sweats and is not constant.  She denies any external rashes or vaginal irritation.  No blood in the urine.  No worsening or alleviating factors.  Social History   Socioeconomic History  . Marital status: Married    Spouse name: Not on file  . Number of children: 3  . Years of education: Not on file  . Highest education level: Not on file  Occupational History  . Occupation: [phone]    Employer: [employer]   Social Needs  . Financial resource strain: Not on file  . Food insecurity    Worry: Not on file    Inability: Not on file  . Transportation needs    Medical: Not on file    Non-medical: Not on file  Tobacco Use  . Smoking status: Never Smoker  . Smokeless tobacco: Never Used  Substance and Sexual Activity  . Alcohol use: No  . Drug use: No  . Sexual activity: Yes    Birth control/protection: None    Comment: married, adopted a sons, 1 abortion, works a English as a second language teacher, does aroebics 3 X week, trying to lose weight.  Lifestyle  . Physical activity    Days per week: Not on file    Minutes per session: Not on file  . Stress: Not on file  Relationships  . Social Herbalist on phone: Not on file    Gets together: Not on file    Attends religious service: Not on file    Active member of club or organization: Not on file    Attends meetings of clubs or organizations: Not on file    Relationship status: Not on file  . Intimate partner violence    Fear of current or ex partner: Not on file    Emotionally abused: Not on file    Physically abused: Not on file    Forced sexual activity: Not on file  Other Topics Concern  . Not on  file  Social History Narrative   Some exercise but not regular.  Works in a Product manager.    Health Maintenance  Topic Date Due  . PAP SMEAR-Modifier  03/02/2020  . TETANUS/TDAP  07/20/2020  . INFLUENZA VACCINE  Completed  . HIV Screening  Completed    The following portions of the patient's history were reviewed and updated as appropriate: allergies, current medications, past family history, past medical history, past social history, past surgical history and problem list.  Review of Systems A comprehensive review of systems was negative.   Objective:    BP 129/87   Pulse 77   Ht 5\' 4"  (1.626 m)   Wt 193 lb (87.5 kg)   SpO2 99%   BMI 33.13 kg/m  General appearance: alert, cooperative and appears stated age Head: Normocephalic, without obvious abnormality, atraumatic Eyes: conj clear, EOMI, PEERLA Ears: normal TM's and external ear canals both ears Nose: Nares normal. Septum midline. Mucosa normal. No drainage or sinus tenderness. Throat: lips, mucosa, and tongue normal; teeth and gums normal Neck: no adenopathy, no carotid bruit, no JVD, supple, symmetrical, trachea midline and thyroid  not enlarged, symmetric, no tenderness/mass/nodules Back: symmetric, no curvature. ROM normal. No CVA tenderness. Lungs: clear to auscultation bilaterally Breasts: normal appearance, no masses or tenderness Heart: regular rate and rhythm, S1, S2 normal, no murmur, click, rub or gallop Abdomen: soft, non-tender; bowel sounds normal; no masses,  no organomegaly Extremities: extremities normal, atraumatic, no cyanosis or edema Pulses: 2+ and symmetric Skin: Skin color, texture, turgor normal. No rashes or lesions Lymph nodes: Cervical, supraclavicular, and axillary nodes normal. Neurologic: Alert and oriented X 3, normal strength and tone. Normal symmetric reflexes. Normal coordination and gait    Assessment:    Healthy female exam.     Plan:     See After Visit Summary for Counseling  Recommendations   Keep up a regular exercise program and make sure you are eating a healthy diet Try to eat 4 servings of dairy a day, or if you are lactose intolerant take a calcium with vitamin D daily.  Your vaccines are up to date.  Due for yearly lab work. Flu vaccine given today.  Due for up-to-date labs.  Dysuria-check urinalysis for UTI.  Call with results once available.

## 2019-02-18 NOTE — Patient Instructions (Signed)
Health Maintenance, Female Adopting a healthy lifestyle and getting preventive care are important in promoting health and wellness. Ask your health care provider about:  The right schedule for you to have regular tests and exams.  Things you can do on your own to prevent diseases and keep yourself healthy. What should I know about diet, weight, and exercise? Eat a healthy diet   Eat a diet that includes plenty of vegetables, fruits, low-fat dairy products, and lean protein.  Do not eat a lot of foods that are high in solid fats, added sugars, or sodium. Maintain a healthy weight Body mass index (BMI) is used to identify weight problems. It estimates body fat based on height and weight. Your health care provider can help determine your BMI and help you achieve or maintain a healthy weight. Get regular exercise Get regular exercise. This is one of the most important things you can do for your health. Most adults should:  Exercise for at least 150 minutes each week. The exercise should increase your heart rate and make you sweat (moderate-intensity exercise).  Do strengthening exercises at least twice a week. This is in addition to the moderate-intensity exercise.  Spend less time sitting. Even light physical activity can be beneficial. Watch cholesterol and blood lipids Have your blood tested for lipids and cholesterol at 46 years of age, then have this test every 5 years. Have your cholesterol levels checked more often if:  Your lipid or cholesterol levels are high.  You are older than 46 years of age.  You are at high risk for heart disease. What should I know about cancer screening? Depending on your health history and family history, you may need to have cancer screening at various ages. This may include screening for:  Breast cancer.  Cervical cancer.  Colorectal cancer.  Skin cancer.  Lung cancer. What should I know about heart disease, diabetes, and high blood  pressure? Blood pressure and heart disease  High blood pressure causes heart disease and increases the risk of stroke. This is more likely to develop in people who have high blood pressure readings, are of African descent, or are overweight.  Have your blood pressure checked: ? Every 3-5 years if you are 18-39 years of age. ? Every year if you are 40 years old or older. Diabetes Have regular diabetes screenings. This checks your fasting blood sugar level. Have the screening done:  Once every three years after age 40 if you are at a normal weight and have a low risk for diabetes.  More often and at a younger age if you are overweight or have a high risk for diabetes. What should I know about preventing infection? Hepatitis B If you have a higher risk for hepatitis B, you should be screened for this virus. Talk with your health care provider to find out if you are at risk for hepatitis B infection. Hepatitis C Testing is recommended for:  Everyone born from 1945 through 1965.  Anyone with known risk factors for hepatitis C. Sexually transmitted infections (STIs)  Get screened for STIs, including gonorrhea and chlamydia, if: ? You are sexually active and are younger than 46 years of age. ? You are older than 46 years of age and your health care provider tells you that you are at risk for this type of infection. ? Your sexual activity has changed since you were last screened, and you are at increased risk for chlamydia or gonorrhea. Ask your health care provider if   you are at risk.  Ask your health care provider about whether you are at high risk for HIV. Your health care provider may recommend a prescription medicine to help prevent HIV infection. If you choose to take medicine to prevent HIV, you should first get tested for HIV. You should then be tested every 3 months for as long as you are taking the medicine. Pregnancy  If you are about to stop having your period (premenopausal) and  you may become pregnant, seek counseling before you get pregnant.  Take 400 to 800 micrograms (mcg) of folic acid every day if you become pregnant.  Ask for birth control (contraception) if you want to prevent pregnancy. Osteoporosis and menopause Osteoporosis is a disease in which the bones lose minerals and strength with aging. This can result in bone fractures. If you are 65 years old or older, or if you are at risk for osteoporosis and fractures, ask your health care provider if you should:  Be screened for bone loss.  Take a calcium or vitamin D supplement to lower your risk of fractures.  Be given hormone replacement therapy (HRT) to treat symptoms of menopause. Follow these instructions at home: Lifestyle  Do not use any products that contain nicotine or tobacco, such as cigarettes, e-cigarettes, and chewing tobacco. If you need help quitting, ask your health care provider.  Do not use street drugs.  Do not share needles.  Ask your health care provider for help if you need support or information about quitting drugs. Alcohol use  Do not drink alcohol if: ? Your health care provider tells you not to drink. ? You are pregnant, may be pregnant, or are planning to become pregnant.  If you drink alcohol: ? Limit how much you use to 0-1 drink a day. ? Limit intake if you are breastfeeding.  Be aware of how much alcohol is in your drink. In the [place] one drink equals one 12 oz bottle of beer (355 mL), one 5 oz glass of wine (148 mL), or one 1 oz glass of hard liquor (44 mL). General instructions  Schedule regular health, dental, and eye exams.  Stay current with your vaccines.  Tell your health care provider if: ? You often feel depressed. ? You have ever been abused or do not feel safe at home. Summary  Adopting a healthy lifestyle and getting preventive care are important in promoting health and wellness.  Follow your health care provider's instructions about healthy  diet, exercising, and getting tested or screened for diseases.  Follow your health care provider's instructions on monitoring your cholesterol and blood pressure. This information is not intended to replace advice given to you by your health care provider. Make sure you discuss any questions you have with your health care provider. Document Released: 11/29/2010 Document Revised: 05/09/2018 Document Reviewed: 05/09/2018 Elsevier Patient Education  2020 Elsevier Inc.  

## 2019-02-19 LAB — COMPLETE METABOLIC PANEL WITH GFR
AG Ratio: 1 (calc) (ref 1.0–2.5)
ALT: 21 U/L (ref 6–29)
AST: 21 U/L (ref 10–35)
Albumin: 3.8 g/dL (ref 3.6–5.1)
Alkaline phosphatase (APISO): 83 U/L (ref 31–125)
BUN: 12 mg/dL (ref 7–25)
CO2: 29 mmol/L (ref 20–32)
Calcium: 9.4 mg/dL (ref 8.6–10.2)
Chloride: 102 mmol/L (ref 98–110)
Creat: 0.79 mg/dL (ref 0.50–1.10)
GFR, Est African American: 104 mL/min/{1.73_m2} (ref >=60)
GFR, Est Non African American: 90 mL/min/{1.73_m2} (ref >=60)
Globulin: 3.8 g/dL (calc) — ABNORMAL HIGH (ref 1.9–3.7)
Glucose, Bld: 95 mg/dL (ref 65–99)
Potassium: 3.4 mmol/L — ABNORMAL LOW (ref 3.5–5.3)
Sodium: 142 mmol/L (ref 135–146)
Total Bilirubin: 0.3 mg/dL (ref 0.2–1.2)
Total Protein: 7.6 g/dL (ref 6.1–8.1)

## 2019-02-19 LAB — URINALYSIS, ROUTINE W REFLEX MICROSCOPIC
Bilirubin Urine: NEGATIVE
Glucose, UA: NEGATIVE
Hgb urine dipstick: NEGATIVE
Ketones, ur: NEGATIVE
Leukocytes,Ua: NEGATIVE
Nitrite: NEGATIVE
Protein, ur: NEGATIVE
Specific Gravity, Urine: 1.01 (ref 1.001–1.03)
pH: 8 (ref 5.0–8.0)

## 2019-02-19 LAB — CBC
HCT: 44.1 % (ref 35.0–45.0)
Hemoglobin: 14.6 g/dL (ref 11.7–15.5)
MCH: 26.5 pg — ABNORMAL LOW (ref 27.0–33.0)
MCHC: 33.1 g/dL (ref 32.0–36.0)
MCV: 80.2 fL (ref 80.0–100.0)
MPV: 12.9 fL — ABNORMAL HIGH (ref 7.5–12.5)
Platelets: 219 10*3/uL (ref 140–400)
RBC: 5.5 10*6/uL — ABNORMAL HIGH (ref 3.80–5.10)
RDW: 13.1 % (ref 11.0–15.0)
WBC: 6.8 10*3/uL (ref 3.8–10.8)

## 2019-02-19 LAB — LIPID PANEL
Cholesterol: 177 mg/dL (ref <200)
HDL: 47 mg/dL — ABNORMAL LOW (ref >=50)
LDL Cholesterol (Calc): 109 mg/dL (calc) — ABNORMAL HIGH
Non-HDL Cholesterol (Calc): 130 mg/dL (calc) — ABNORMAL HIGH (ref <130)
Total CHOL/HDL Ratio: 3.8 (calc) (ref <5.0)
Triglycerides: 107 mg/dL (ref <150)

## 2019-02-27 LAB — HM MAMMOGRAPHY

## 2019-04-11 ENCOUNTER — Encounter: Payer: Self-pay | Admitting: Family Medicine

## 2019-07-06 ENCOUNTER — Emergency Department (INDEPENDENT_AMBULATORY_CARE_PROVIDER_SITE_OTHER)
Admission: EM | Admit: 2019-07-06 | Discharge: 2019-07-06 | Disposition: A | Discharge status: Home / Self Care | Payer: Managed Care, Other (non HMO) | Source: Home / Self Care

## 2019-07-06 ENCOUNTER — Other Ambulatory Visit: Payer: Self-pay

## 2019-07-06 ENCOUNTER — Encounter: Payer: Self-pay | Admitting: Emergency Medicine

## 2019-07-06 DIAGNOSIS — J020 Streptococcal pharyngitis: Secondary | ICD-10-CM | POA: Diagnosis not present

## 2019-07-06 DIAGNOSIS — J029 Acute pharyngitis, unspecified: Secondary | ICD-10-CM

## 2019-07-06 LAB — POCT RAPID STREP A (OFFICE): Rapid Strep A Screen: POSITIVE — AB

## 2019-07-06 MED ORDER — CEFDINIR 300 MG PO CAPS: 600.0000 mg | ORAL_CAPSULE | Freq: Every day | ORAL | 0 refills | Status: DC

## 2019-07-06 NOTE — ED Triage Notes (Signed)
Patient c/o sore throat x today, dysphasia, afebrile.

## 2019-07-06 NOTE — ED Provider Notes (Addendum)
[name]    CSN: [id number] Arrival date & time: 07/06/19  0918      History   Chief Complaint Chief Complaint  Patient presents with  . Sore Throat    HPI Anna Patel is a 49 y.o. female.   This a 49 year old established [facility] urgent care patient complaining of sore throat.  She had a strep infection 1 year ago when she was seen here.  Patient works as a Environmental education officer.  No one else at work has been sick.  She developed her symptoms yesterday and they got quite worse today.     Past Medical History:  Diagnosis Date  . Chronic steroid use   . JRA (juvenile rheumatoid arthritis) (Anna Patel)   . Preeclampsia    delivery 37 weeks  . SLE (systemic lupus erythematosus) (Anna Patel)     Patient Active Problem List   Diagnosis Date Noted  . Hypokalemia 09/05/2018  . Vitamin D deficiency 10/20/2016  . JRA (juvenile rheumatoid arthritis) (Anna Patel) 12/16/2011  . PROTEINURIA 01/06/2009  . ESSENTIAL HYPERTENSION, BENIGN 12/15/2008  . DERMATITIS, ATOPIC 02/27/2007  . OBESITY, NOS 03/07/2006  . Systemic lupus erythematosus (SLE) in adult Anna Patel) 03/07/2006    Past Surgical History:  Procedure Laterality Date  . TUBAL LIGATION      OB History    Gravida  1   Para  1   Term      Preterm      AB      Living  1     SAB      TAB      Ectopic      Multiple      Live Births               Home Medications    Prior to Admission medications   Medication Sig Start Date End Date Taking? Authorizing Provider  amLODipine (NORVASC) 10 MG tablet TAKE 1 TABLET BY MOUTH DAILY. 02/18/19  Yes Hali Marry, MD  aspirin EC 81 MG tablet Take 81 mg by mouth daily.     Yes [provider]  Calcium Carbonate-Vitamin D 500-125 MG-UNIT TABS Take by mouth 2 (two) times daily.     Yes [provider]  hydroxychloroquine (PLAQUENIL) 200 MG tablet Take by mouth 2 (two) times daily.     Yes [provider]    losartan-hydrochlorothiazide (HYZAAR) 100-25 MG tablet Take 1 tablet by mouth daily. 02/18/19  Yes Hali Marry, MD  Multiple Vitamin (MULTIVITAMIN) tablet Take 1 tablet by mouth daily.     Yes [provider]  cefdinir (OMNICEF) 300 MG capsule Take 2 capsules (600 mg total) by mouth daily. 07/06/19   Robyn Haber, MD  potassium chloride (K-DUR) 10 MEQ tablet Take 1 tablet (10 mEq total) by mouth daily. 02/18/19 05/19/19  Hali Marry, MD    Family History Family History  Problem Relation Age of Onset  . Heart disease Mother        In her 49's  . Hypertension Mother   . Stroke Mother        CVA x 2, in her 21s  . Breast cancer Mother        DCIS  . Goiter Mother   . Lupus Paternal Aunt        Deceased    Social History Social History   Tobacco Use  . Smoking status: Never Smoker  . Smokeless tobacco: Never Used  Substance Use Topics  . Alcohol  use: No  . Drug use: No     Allergies   Sulfa antibiotics and Sulfonamide derivatives   Review of Systems Review of Systems  Constitutional: Positive for chills and fever.  HENT: Positive for sore throat.   All other systems reviewed and are negative.    Physical Exam Triage Vital Signs ED Triage Vitals  Enc Vitals Group     BP 07/06/19 0931 (!) 143/92     Pulse Rate 07/06/19 0931 95     Resp --      Temp 07/06/19 0931 (!) 100.4 F (38 C)     Temp Source 07/06/19 0931 Oral     SpO2 07/06/19 0931 99 %     Weight 07/06/19 0932 194 lb 8 oz (88.2 kg)     Height 07/06/19 0932 5\' 4"  (1.626 m)     Head Circumference --      Peak Flow --      Pain Score 07/06/19 0932 5     Pain Loc --      Pain Edu? --      Excl. in [place]? --    No data found.  Updated Vital Signs BP (!) 143/92 (BP Location: Left Arm)   Pulse 95   Temp (!) 100.4 F (38 C) (Oral)   Ht 5\' 4"  (1.626 m)   Wt 88.2 kg   SpO2 99%   BMI 33.39 kg/m    Physical Exam Vitals and nursing note reviewed.  Constitutional:       Appearance: She is well-developed. She is obese.  HENT:     Head: Normocephalic.     Mouth/Throat:     Mouth: Mucous membranes are moist.     Pharynx: Uvula midline. Pharyngeal swelling, oropharyngeal exudate and posterior oropharyngeal erythema present.     Tonsils: Tonsillar exudate present. No tonsillar abscesses.  Eyes:     Conjunctiva/sclera: Conjunctivae normal.  Musculoskeletal:     Cervical back: Normal range of motion and neck supple.  Lymphadenopathy:     Cervical: No cervical adenopathy.  Skin:    General: Skin is warm and dry.  Neurological:     General: No focal deficit present.     Mental Status: She is alert.  Psychiatric:        Mood and Affect: Mood normal.        Behavior: Behavior normal.      UC Treatments / Results  Labs (all labs ordered are listed, but only abnormal results are displayed) Labs Reviewed  POCT RAPID STREP A (OFFICE) - Abnormal; Notable for the following components:      Result Value   Rapid Strep A Screen Positive (*)    All other components within normal limits    EKG   Radiology No results found.  Procedures Procedures (including critical care time)  Medications Ordered in UC Medications - No data to display  Initial Impression / Assessment and Plan / UC Course  I have reviewed the triage vital signs and the nursing notes.  Pertinent labs & imaging results that were available during my care of the patient were reviewed by me and considered in my medical decision making (see chart for details).    Final Clinical Impressions(s) / UC Diagnoses   Final diagnoses:  Sore throat  Pharyngitis due to Streptococcus species   Discharge Instructions   None    ED Prescriptions    Medication Sig Dispense Auth. Provider   cefdinir (OMNICEF) 300 MG capsule Take 2 capsules (600  mg total) by mouth daily. 20 capsule Robyn Haber, MD     I have reviewed the PDMP during this encounter.   Robyn Haber, MD 07/06/19  [id number]    Robyn Haber, MD 07/06/19 [phone]

## 2019-07-08 ENCOUNTER — Ambulatory Visit (INDEPENDENT_AMBULATORY_CARE_PROVIDER_SITE_OTHER): Payer: Managed Care, Other (non HMO) | Admitting: Family Medicine

## 2019-07-08 ENCOUNTER — Other Ambulatory Visit: Payer: Self-pay

## 2019-07-08 ENCOUNTER — Encounter: Payer: Self-pay | Admitting: Family Medicine

## 2019-07-08 VITALS — BP 123/74 | HR 74 | Ht 64.0 in | Wt 192.0 lb

## 2019-07-08 DIAGNOSIS — R300 Dysuria: Secondary | ICD-10-CM

## 2019-07-08 DIAGNOSIS — J020 Streptococcal pharyngitis: Secondary | ICD-10-CM

## 2019-07-08 DIAGNOSIS — E876 Hypokalemia: Secondary | ICD-10-CM | POA: Diagnosis not present

## 2019-07-08 LAB — POCT URINALYSIS DIP (CLINITEK)
Bilirubin, UA: NEGATIVE
Glucose, UA: NEGATIVE mg/dL
Ketones, POC UA: NEGATIVE mg/dL
Nitrite, UA: NEGATIVE
POC PROTEIN,UA: NEGATIVE
Spec Grav, UA: 1.025 (ref 1.010–1.025)
Urobilinogen, UA: 1 E.U./dL
pH, UA: 7 (ref 5.0–8.0)

## 2019-07-08 LAB — BASIC METABOLIC PANEL WITH GFR
BUN: 13 mg/dL (ref 7–25)
CO2: 31 mmol/L (ref 20–32)
Calcium: 9.6 mg/dL (ref 8.6–10.2)
Chloride: 102 mmol/L (ref 98–110)
Creat: 0.83 mg/dL (ref 0.50–1.10)
GFR, Est African American: 98 mL/min/{1.73_m2} (ref >=60)
GFR, Est Non African American: 85 mL/min/{1.73_m2} (ref >=60)
Glucose, Bld: 93 mg/dL (ref 65–139)
Potassium: 3.2 mmol/L — ABNORMAL LOW (ref 3.5–5.3)
Sodium: 141 mmol/L (ref 135–146)

## 2019-07-08 NOTE — Progress Notes (Signed)
sxs x 2 wks. She has burning w/urination. She stated that she hasn't taken any OTC meds for this. Denies f/s/c/n/v/d/abd/back/flank pain/dc/blood or odor.   She was seen over the weekend at [facility] for strep and was started on Cefdinir 300 bid x 10 days.

## 2019-07-08 NOTE — Progress Notes (Signed)
Acute Office Visit  Subjective:    Patient ID: Anna Patel, female    DOB: 1970/06/12, 49 y.o.   MRN: [id number]  Chief Complaint  Patient presents with  . Urinary Tract Infection    HPI Patient is in today for sxs x 2 wks. She has burning w/urination. No urgency.  She stated that she hasn't taken any OTC meds for this. Denies f/s/c/n/v/d/abd/back/flank pain/dc/blood or odor. No vaginal irritation, rash or discharge.  She passed a kidney stone in October. See ED note.  She was vomitnig and dehydrated at the time.   She was seen over the weekend at [facility] for strep and was started on Cefdinir 300 bid x 10 days. In regards the the strep throat she is feeling much better. She never ran a fever.    Her husband was diagnosed with Leukemia and has been hospitalized at [facility] since about 19th of Jan.  He has received treatments and will be undergoing a bone marrow bx this week.   Past Medical History:  Diagnosis Date  . Chronic steroid use   . JRA (juvenile rheumatoid arthritis) ([place])   . Preeclampsia    delivery 37 weeks  . SLE (systemic lupus erythematosus) ([place])     Past Surgical History:  Procedure Laterality Date  . TUBAL LIGATION      Family History  Problem Relation Age of Onset  . Heart disease Mother        In her 32's  . Hypertension Mother   . Stroke Mother        CVA x 2, in her 32s  . Breast cancer Mother        DCIS  . Goiter Mother   . Lupus Paternal Aunt        Deceased    Social History   Socioeconomic History  . Marital status: Married    Spouse name: Not on file  . Number of children: 3  . Years of education: Not on file  . Highest education level: Not on file  Occupational History  . Occupation: [phone]    Employer: [employer]   Tobacco Use  . Smoking status: Never Smoker  . Smokeless tobacco: Never Used  Substance and Sexual Activity  . Alcohol use: No  . Drug use: No  . Sexual activity: Yes    Birth control/protection: None     Comment: married, adopted a sons, 1 abortion, works a English as a second language teacher, does aroebics 3 X week, trying to lose weight.  Other Topics Concern  . Not on file  Social History Narrative   Some exercise but not regular.  Works in a Product manager.    Social Determinants of Health   Financial Resource Strain:   . Difficulty of Paying Living Expenses: Not on file  Food Insecurity:   . Worried About Charity fundraiser in the Last Year: Not on file  . Ran Out of Food in the Last Year: Not on file  Transportation Needs:   . Lack of Transportation (Medical): Not on file  . Lack of Transportation (Non-Medical): Not on file  Physical Activity:   . Days of Exercise per Week: Not on file  . Minutes of Exercise per Session: Not on file  Stress:   . Feeling of Stress : Not on file  Social Connections:   . Frequency of Communication with Friends and Family: Not on file  . Frequency of Social Gatherings with Friends and Family: Not on file  . Attends  Religious Services: Not on file  . Active Member of Clubs or Organizations: Not on file  . Attends Archivist Meetings: Not on file  . Marital Status: Not on file  Intimate Partner Violence:   . Fear of Current or Ex-Partner: Not on file  . Emotionally Abused: Not on file  . Physically Abused: Not on file  . Sexually Abused: Not on file    Outpatient Medications Prior to Visit  Medication Sig Dispense Refill  . amLODipine (NORVASC) 10 MG tablet TAKE 1 TABLET BY MOUTH DAILY. 90 tablet 1  . aspirin EC 81 MG tablet Take 81 mg by mouth daily.      . Calcium Carbonate-Vitamin D 500-125 MG-UNIT TABS Take by mouth 2 (two) times daily.      . cefdinir (OMNICEF) 300 MG capsule Take 2 capsules (600 mg total) by mouth daily. 20 capsule 0  . hydroxychloroquine (PLAQUENIL) 200 MG tablet Take by mouth 2 (two) times daily.      Marland Kitchen losartan-hydrochlorothiazide (HYZAAR) 100-25 MG tablet Take 1 tablet by mouth daily. 90 tablet 1  . Multiple Vitamin  (MULTIVITAMIN) tablet Take 1 tablet by mouth daily.      . potassium chloride (K-DUR) 10 MEQ tablet Take 1 tablet (10 mEq total) by mouth daily. 90 tablet 3   No facility-administered medications prior to visit.    Allergies  Allergen Reactions  . Sulfa Antibiotics Nausea And Vomiting    Aggravates lupus symptoms  . Sulfonamide Derivatives     Sulfa drugs trigger patient's lupsus    Review of Systems     Objective:    Physical Exam Constitutional:      Appearance: She is well-developed.  HENT:     Head: Normocephalic and atraumatic.     Nose: Nose normal.     Mouth/Throat:     Comments: Posterior pharynx only mildly erythematous.  No exudate. Cardiovascular:     Rate and Rhythm: Normal rate and regular rhythm.     Heart sounds: Normal heart sounds.  Pulmonary:     Effort: Pulmonary effort is normal.     Breath sounds: Normal breath sounds.  Skin:    General: Skin is warm and dry.  Neurological:     Mental Status: She is alert and oriented to person, place, and time.  Psychiatric:        Behavior: Behavior normal.     BP 123/74   Pulse 74   Ht 5\' 4"  (1.626 m)   Wt 192 lb (87.1 kg)   SpO2 100%   BMI 32.96 kg/m  Wt Readings from Last 3 Encounters:  07/08/19 192 lb (87.1 kg)  07/06/19 194 lb 8 oz (88.2 kg)  02/18/19 193 lb (87.5 kg)    There are no preventive care reminders to display for this patient.  There are no preventive care reminders to display for this patient.   Lab Results  Component Value Date   TSH 0.66 09/04/2018   Lab Results  Component Value Date   WBC 6.8 02/18/2019   HGB 14.6 02/18/2019   HCT 44.1 02/18/2019   MCV 80.2 02/18/2019   PLT 219 02/18/2019   Lab Results  Component Value Date   NA 142 02/18/2019   K 3.4 (L) 02/18/2019   CO2 29 02/18/2019   GLUCOSE 95 02/18/2019   BUN 12 02/18/2019   CREATININE 0.79 02/18/2019   BILITOT 0.3 02/18/2019   ALKPHOS 73 03/31/2016   AST 21 02/18/2019   ALT 21 02/18/2019  PROT 7.6  02/18/2019   ALBUMIN 3.6 03/31/2016   CALCIUM 9.4 02/18/2019   Lab Results  Component Value Date   CHOL 177 02/18/2019   Lab Results  Component Value Date   HDL 47 (L) 02/18/2019   Lab Results  Component Value Date   LDLCALC 109 (H) 02/18/2019   Lab Results  Component Value Date   TRIG 107 02/18/2019   Lab Results  Component Value Date   CHOLHDL 3.8 02/18/2019   Lab Results  Component Value Date   HGBA1C 5.8 (H) 02/14/2018       Assessment & Plan:   Problem List Items Addressed This Visit      Other   Hypokalemia   Relevant Orders   BASIC METABOLIC PANEL WITH GFR    Other Visit Diagnoses    Dysuria    -  Primary   Relevant Orders   Urine Culture   Burning with urination       Relevant Orders   POCT URINALYSIS DIP (CLINITEK) (Completed)   Strep pharyngitis         Dysuria/burning with urination - urinalysis did show some trace leuks and blood today so we will send for culture.  She is already on Omnicef so I am a little hesitant to put her on 2 antibiotics unless we need to.  Husband acutely ill.  Sounds like she has a good support system with friends and family.  And hopefully he will be coming home soon she seems very positive.  Hypokalemia-due to recheck potassium level I suspect it was likely from vomiting and dehydration.  Strep throat-seems to be much better today.  Throat on inspection is looking better.  Just encouraged her to make sure to complete all of her antibiotics.   No orders of the defined types were placed in this encounter.  Time spent 25 minutes in encounter.   Beatrice Lecher, MD

## 2019-07-09 LAB — URINE CULTURE
MICRO NUMBER:: 10127794
SPECIMEN QUALITY:: ADEQUATE

## 2019-08-19 ENCOUNTER — Ambulatory Visit: Payer: Managed Care, Other (non HMO) | Admitting: Family Medicine

## 2019-09-14 IMAGING — DX CHEST - 2 VIEW
2 series · 2 of 2 positions shown · non-contrast
Comparison: May 30, 2011

CLINICAL DATA: Chest tightness

EXAM:
CHEST - 2 VIEW

[chest pa]
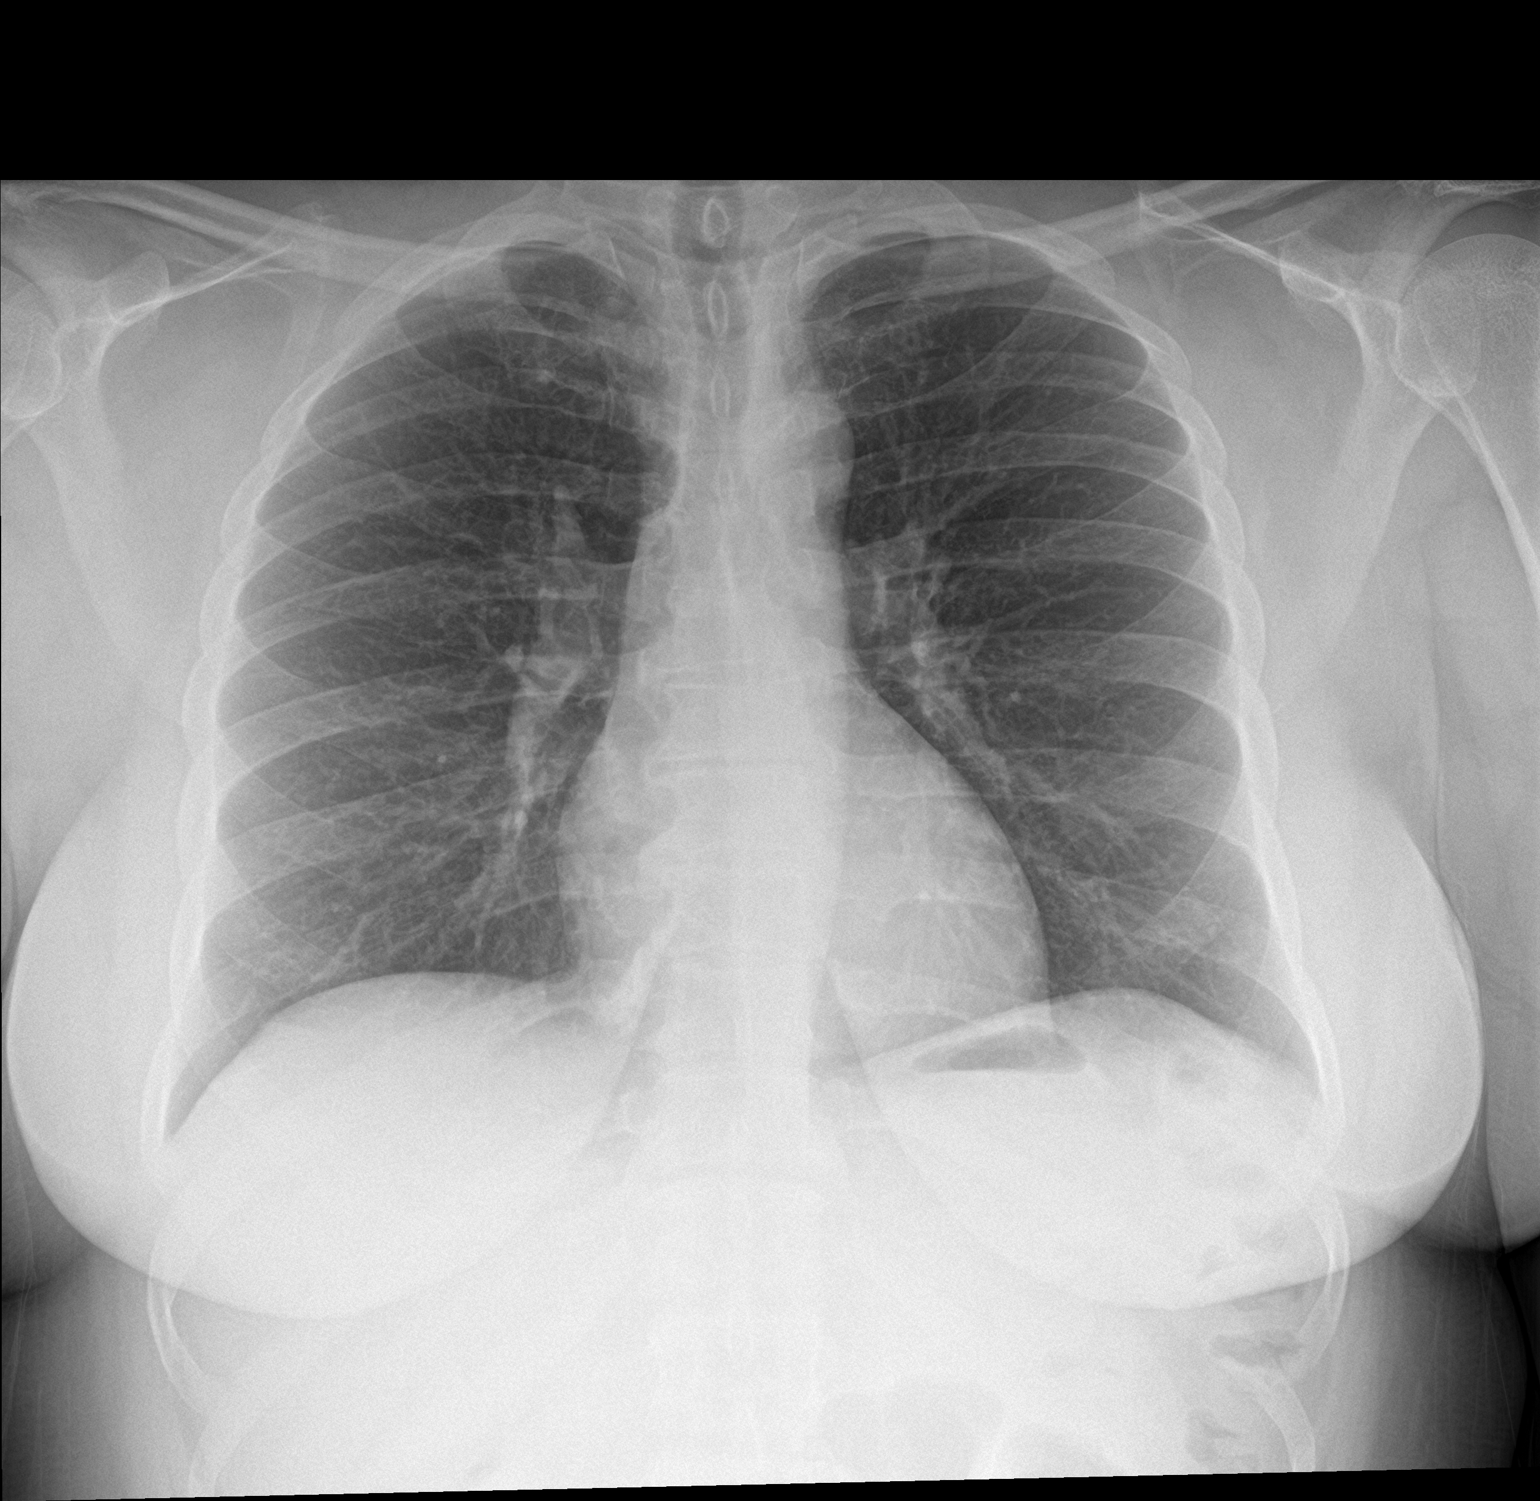

[chest lat]
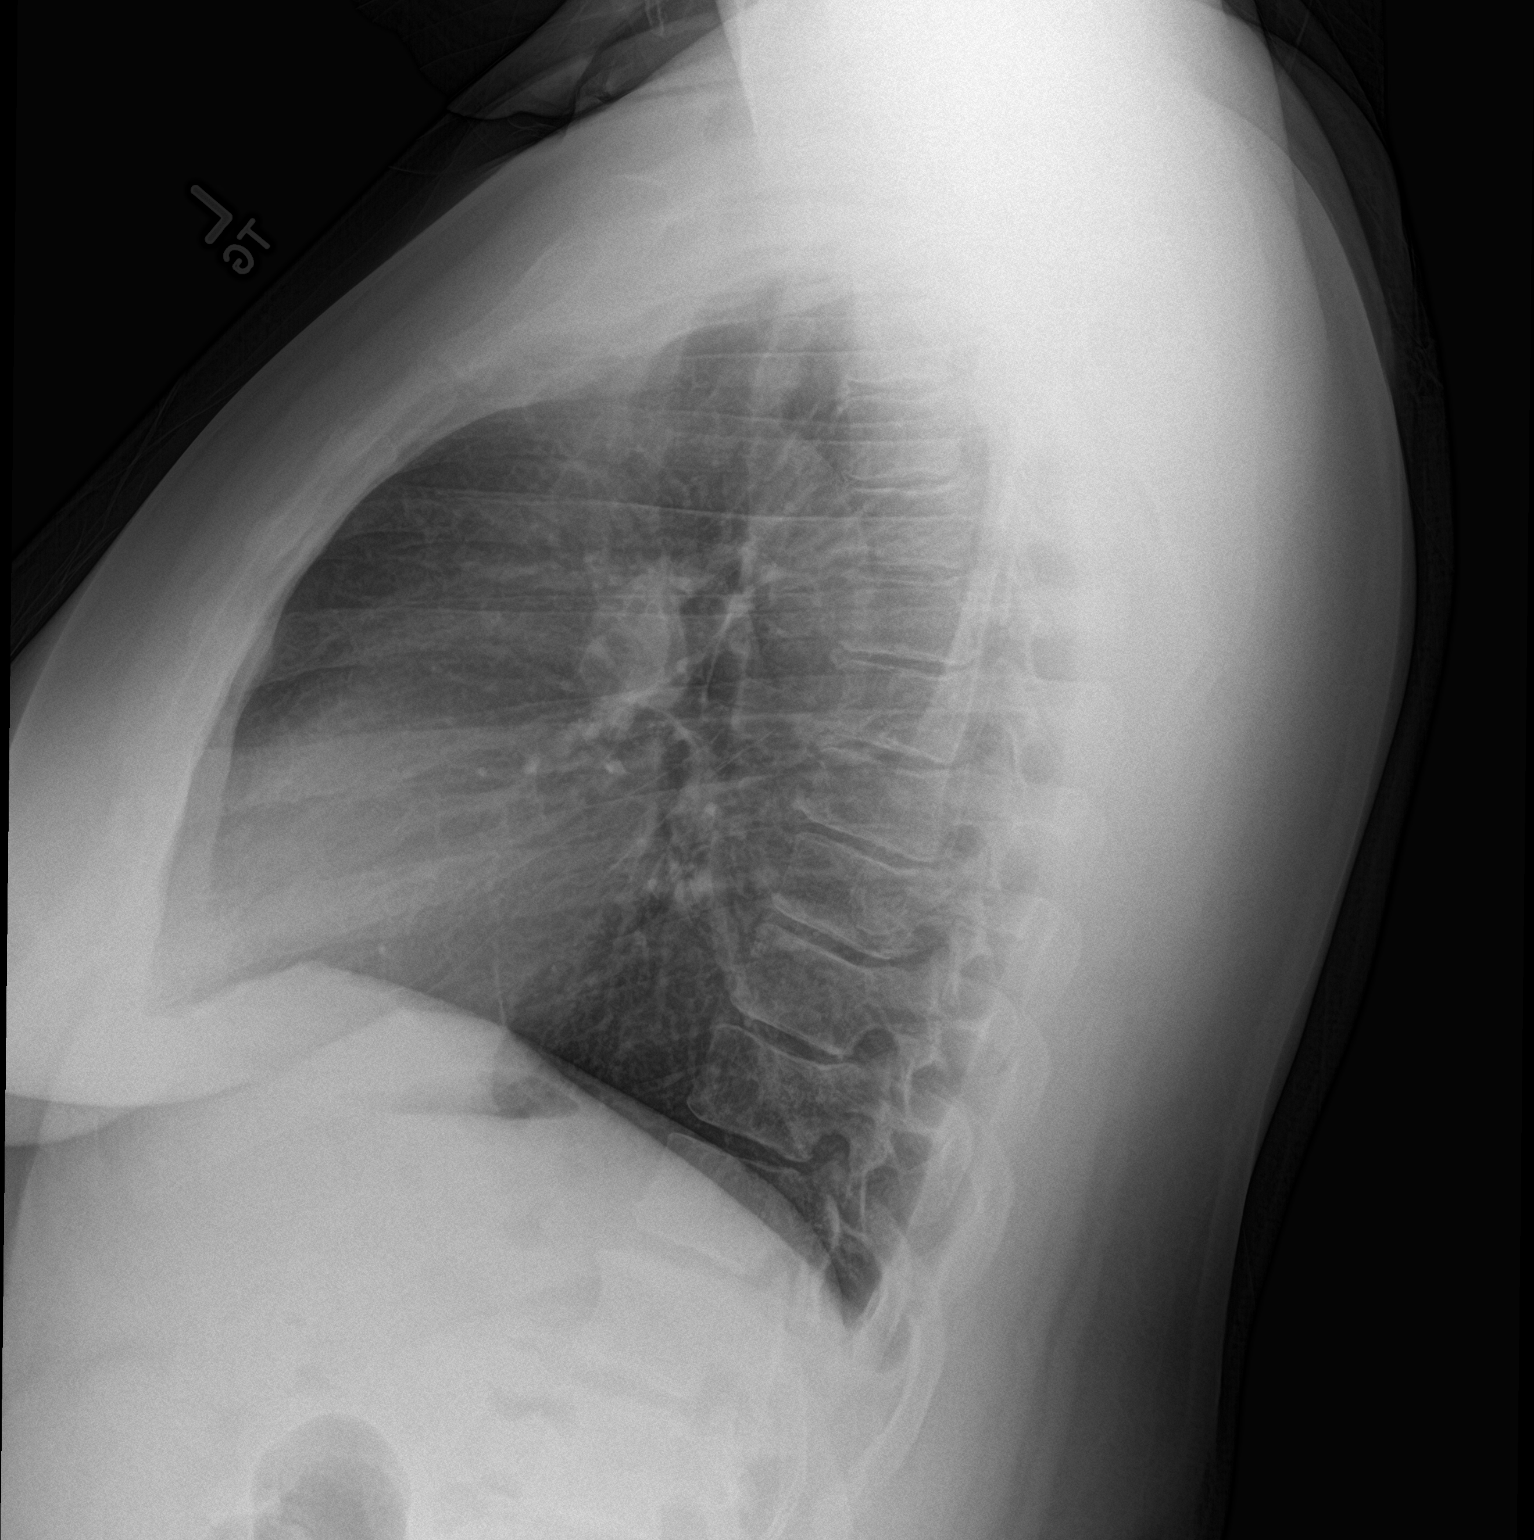

[2 of 2 positions shown; findings below may reference images not displayed]

FINDINGS: No edema or consolidation. The heart size and pulmonary vascularity
are normal. No adenopathy. No pneumothorax. No bone lesions.
IMPRESSION: No edema or consolidation.

## 2019-09-20 ENCOUNTER — Other Ambulatory Visit: Payer: Self-pay

## 2019-09-20 MED ORDER — LOSARTAN POTASSIUM-HCTZ 100-25 MG PO TABS: 1.0000 | ORAL_TABLET | Freq: Every day | ORAL | 0 refills | Status: DC

## 2019-09-23 ENCOUNTER — Other Ambulatory Visit: Payer: Self-pay | Admitting: *Deleted

## 2019-09-23 DIAGNOSIS — I10 Essential (primary) hypertension: Secondary | ICD-10-CM

## 2019-09-23 MED ORDER — AMLODIPINE BESYLATE 10 MG PO TABS: ORAL_TABLET | ORAL | 1 refills | Status: DC

## 2019-11-13 ENCOUNTER — Other Ambulatory Visit: Payer: Self-pay | Admitting: *Deleted

## 2019-11-13 MED ORDER — LOSARTAN POTASSIUM-HCTZ 100-25 MG PO TABS: 1.0000 | ORAL_TABLET | Freq: Every day | ORAL | 0 refills | Status: DC

## 2020-02-10 DIAGNOSIS — Z79899 Other long term (current) drug therapy: Secondary | ICD-10-CM | POA: Insufficient documentation

## 2020-02-19 ENCOUNTER — Ambulatory Visit (INDEPENDENT_AMBULATORY_CARE_PROVIDER_SITE_OTHER): Payer: Managed Care, Other (non HMO) | Admitting: Family Medicine

## 2020-02-19 ENCOUNTER — Other Ambulatory Visit (HOSPITAL_COMMUNITY)
Admission: RE | Admit: 2020-02-19 | Discharge: 2020-02-19 | Disposition: A | Discharge status: Home / Self Care | Payer: Managed Care, Other (non HMO) | Source: Ambulatory Visit | Attending: Family Medicine | Admitting: Family Medicine

## 2020-02-19 ENCOUNTER — Other Ambulatory Visit: Payer: Self-pay

## 2020-02-19 ENCOUNTER — Encounter: Payer: Self-pay | Admitting: Family Medicine

## 2020-02-19 VITALS — BP 128/90 | HR 83 | Temp 98.7°F | Wt 197.1 lb

## 2020-02-19 DIAGNOSIS — Z124 Encounter for screening for malignant neoplasm of cervix: Secondary | ICD-10-CM | POA: Diagnosis not present

## 2020-02-19 DIAGNOSIS — Z23 Encounter for immunization: Secondary | ICD-10-CM

## 2020-02-19 DIAGNOSIS — Z0000 Encounter for general adult medical examination without abnormal findings: Secondary | ICD-10-CM

## 2020-02-19 LAB — CBC
HCT: 45.2 % — ABNORMAL HIGH (ref 35.0–45.0)
Hemoglobin: 14.9 g/dL (ref 11.7–15.5)
MCH: 26.8 pg — ABNORMAL LOW (ref 27.0–33.0)
MCHC: 33 g/dL (ref 32.0–36.0)
MCV: 81.3 fL (ref 80.0–100.0)
MPV: 12.5 fL (ref 7.5–12.5)
Platelets: 232 10*3/uL (ref 140–400)
RBC: 5.56 10*6/uL — ABNORMAL HIGH (ref 3.80–5.10)
RDW: 13.3 % (ref 11.0–15.0)
WBC: 7.9 10*3/uL (ref 3.8–10.8)

## 2020-02-19 LAB — COMPLETE METABOLIC PANEL WITH GFR
AG Ratio: 1.1 (calc) (ref 1.0–2.5)
ALT: 13 U/L (ref 6–29)
AST: 17 U/L (ref 10–35)
Albumin: 3.9 g/dL (ref 3.6–5.1)
Alkaline phosphatase (APISO): 93 U/L (ref 31–125)
BUN: 13 mg/dL (ref 7–25)
CO2: 31 mmol/L (ref 20–32)
Calcium: 9.4 mg/dL (ref 8.6–10.2)
Chloride: 101 mmol/L (ref 98–110)
Creat: 0.75 mg/dL (ref 0.50–1.10)
GFR, Est African American: 110 mL/min/{1.73_m2} (ref >=60)
GFR, Est Non African American: 95 mL/min/{1.73_m2} (ref >=60)
Globulin: 3.7 g/dL (calc) (ref 1.9–3.7)
Glucose, Bld: 83 mg/dL (ref 65–99)
Potassium: 3.2 mmol/L — ABNORMAL LOW (ref 3.5–5.3)
Sodium: 140 mmol/L (ref 135–146)
Total Bilirubin: 0.5 mg/dL (ref 0.2–1.2)
Total Protein: 7.6 g/dL (ref 6.1–8.1)

## 2020-02-19 LAB — LIPID PANEL W/REFLEX DIRECT LDL
Cholesterol: 177 mg/dL (ref <200)
HDL: 49 mg/dL — ABNORMAL LOW (ref >=50)
LDL Cholesterol (Calc): 105 mg/dL (calc) — ABNORMAL HIGH
Non-HDL Cholesterol (Calc): 128 mg/dL (calc) (ref <130)
Total CHOL/HDL Ratio: 3.6 (calc) (ref <5.0)
Triglycerides: 134 mg/dL (ref <150)

## 2020-02-19 NOTE — Patient Instructions (Signed)
Health Maintenance, Female Adopting a healthy lifestyle and getting preventive care are important in promoting health and wellness. Ask your health care provider about:  The right schedule for you to have regular tests and exams.  Things you can do on your own to prevent diseases and keep yourself healthy. What should I know about diet, weight, and exercise? Eat a healthy diet   Eat a diet that includes plenty of vegetables, fruits, low-fat dairy products, and lean protein.  Do not eat a lot of foods that are high in solid fats, added sugars, or sodium. Maintain a healthy weight Body mass index (BMI) is used to identify weight problems. It estimates body fat based on height and weight. Your health care provider can help determine your BMI and help you achieve or maintain a healthy weight. Get regular exercise Get regular exercise. This is one of the most important things you can do for your health. Most adults should:  Exercise for at least 150 minutes each week. The exercise should increase your heart rate and make you sweat (moderate-intensity exercise).  Do strengthening exercises at least twice a week. This is in addition to the moderate-intensity exercise.  Spend less time sitting. Even light physical activity can be beneficial. Watch cholesterol and blood lipids Have your blood tested for lipids and cholesterol at 47 years of age, then have this test every 5 years. Have your cholesterol levels checked more often if:  Your lipid or cholesterol levels are high.  You are older than 47 years of age.  You are at high risk for heart disease. What should I know about cancer screening? Depending on your health history and family history, you may need to have cancer screening at various ages. This may include screening for:  Breast cancer.  Cervical cancer.  Colorectal cancer.  Skin cancer.  Lung cancer. What should I know about heart disease, diabetes, and high blood  pressure? Blood pressure and heart disease  High blood pressure causes heart disease and increases the risk of stroke. This is more likely to develop in people who have high blood pressure readings, are of African descent, or are overweight.  Have your blood pressure checked: ? Every 3-5 years if you are 18-39 years of age. ? Every year if you are 40 years old or older. Diabetes Have regular diabetes screenings. This checks your fasting blood sugar level. Have the screening done:  Once every three years after age 40 if you are at a normal weight and have a low risk for diabetes.  More often and at a younger age if you are overweight or have a high risk for diabetes. What should I know about preventing infection? Hepatitis B If you have a higher risk for hepatitis B, you should be screened for this virus. Talk with your health care provider to find out if you are at risk for hepatitis B infection. Hepatitis C Testing is recommended for:  Everyone born from 1945 through 1965.  Anyone with known risk factors for hepatitis C. Sexually transmitted infections (STIs)  Get screened for STIs, including gonorrhea and chlamydia, if: ? You are sexually active and are younger than 47 years of age. ? You are older than 47 years of age and your health care provider tells you that you are at risk for this type of infection. ? Your sexual activity has changed since you were last screened, and you are at increased risk for chlamydia or gonorrhea. Ask your health care provider if   you are at risk.  Ask your health care provider about whether you are at high risk for HIV. Your health care provider may recommend a prescription medicine to help prevent HIV infection. If you choose to take medicine to prevent HIV, you should first get tested for HIV. You should then be tested every 3 months for as long as you are taking the medicine. Pregnancy  If you are about to stop having your period (premenopausal) and  you may become pregnant, seek counseling before you get pregnant.  Take 400 to 800 micrograms (mcg) of folic acid every day if you become pregnant.  Ask for birth control (contraception) if you want to prevent pregnancy. Osteoporosis and menopause Osteoporosis is a disease in which the bones lose minerals and strength with aging. This can result in bone fractures. If you are 65 years old or older, or if you are at risk for osteoporosis and fractures, ask your health care provider if you should:  Be screened for bone loss.  Take a calcium or vitamin D supplement to lower your risk of fractures.  Be given hormone replacement therapy (HRT) to treat symptoms of menopause. Follow these instructions at home: Lifestyle  Do not use any products that contain nicotine or tobacco, such as cigarettes, e-cigarettes, and chewing tobacco. If you need help quitting, ask your health care provider.  Do not use street drugs.  Do not share needles.  Ask your health care provider for help if you need support or information about quitting drugs. Alcohol use  Do not drink alcohol if: ? Your health care provider tells you not to drink. ? You are pregnant, may be pregnant, or are planning to become pregnant.  If you drink alcohol: ? Limit how much you use to 0-1 drink a day. ? Limit intake if you are breastfeeding.  Be aware of how much alcohol is in your drink. In the [place] one drink equals one 12 oz bottle of beer (355 mL), one 5 oz glass of wine (148 mL), or one 1 oz glass of hard liquor (44 mL). General instructions  Schedule regular health, dental, and eye exams.  Stay current with your vaccines.  Tell your health care provider if: ? You often feel depressed. ? You have ever been abused or do not feel safe at home. Summary  Adopting a healthy lifestyle and getting preventive care are important in promoting health and wellness.  Follow your health care provider's instructions about healthy  diet, exercising, and getting tested or screened for diseases.  Follow your health care provider's instructions on monitoring your cholesterol and blood pressure. This information is not intended to replace advice given to you by your health care provider. Make sure you discuss any questions you have with your health care provider. Document Revised: 05/09/2018 Document Reviewed: 05/09/2018 Elsevier Patient Education  2020 Elsevier Inc.  

## 2020-02-19 NOTE — Progress Notes (Signed)
Subjective:     Anna Patel is a 49 y.o. female and is here for a comprehensive physical exam. The patient reports no problems.  She is doing well overall.  Exercising 2 days/week.  Taking her blood pressure medication regularly.  No recent chest pain or shortness of breath.  Social History   Socioeconomic History  . Marital status: Married    Spouse name: Not on file  . Number of children: 3  . Years of education: Not on file  . Highest education level: Not on file  Occupational History  . Occupation: [phone]    Employer: [employer]   Tobacco Use  . Smoking status: Never Smoker  . Smokeless tobacco: Never Used  Substance and Sexual Activity  . Alcohol use: No  . Drug use: No  . Sexual activity: Yes    Birth control/protection: None    Comment: married, adopted a sons, 1 abortion, works a English as a second language teacher, does aroebics 3 X week, trying to lose weight.  Other Topics Concern  . Not on file  Social History Narrative   Some exercise but not regular.  Works in a Product manager.    Social Determinants of Health   Financial Resource Strain:   . Difficulty of Paying Living Expenses: Not on file  Food Insecurity:   . Worried About Charity fundraiser in the Last Year: Not on file  . Ran Out of Food in the Last Year: Not on file  Transportation Needs:   . Lack of Transportation (Medical): Not on file  . Lack of Transportation (Non-Medical): Not on file  Physical Activity:   . Days of Exercise per Week: Not on file  . Minutes of Exercise per Session: Not on file  Stress:   . Feeling of Stress : Not on file  Social Connections:   . Frequency of Communication with Friends and Family: Not on file  . Frequency of Social Gatherings with Friends and Family: Not on file  . Attends Religious Services: Not on file  . Active Member of Clubs or Organizations: Not on file  . Attends Archivist Meetings: Not on file  . Marital Status: Not on file  Intimate Partner Violence:    . Fear of Current or Ex-Partner: Not on file  . Emotionally Abused: Not on file  . Physically Abused: Not on file  . Sexually Abused: Not on file   Health Maintenance  Topic Date Due  . Hepatitis C Screening  Never done  . INFLUENZA VACCINE  12/29/2019  . PAP SMEAR-Modifier  03/02/2020  . MAMMOGRAM  02/27/2020  . TETANUS/TDAP  07/20/2020  . COVID-19 Vaccine  Completed  . HIV Screening  Completed    The following portions of the patient's history were reviewed and updated as appropriate: allergies, current medications, past family history, past medical history, past social history, past surgical history and problem list.  Review of Systems A comprehensive review of systems was negative.   Objective:    BP 128/90 (BP Location: Left Arm, Patient Position: Sitting, Cuff Size: Large)   Pulse 83   Temp 98.7 F (37.1 C) (Oral)   Wt 197 lb 1.9 oz (89.4 kg)   BMI 33.84 kg/m  General appearance: alert, cooperative and appears stated age Head: Normocephalic, without obvious abnormality, atraumatic Eyes: conj clear. EOMI, PEERLA Ears: normal TM's and external ear canals both ears Nose: Nares normal. Septum midline. Mucosa normal. No drainage or sinus tenderness. Throat: lips, mucosa, and tongue normal; teeth and  gums normal Neck: no adenopathy, no carotid bruit, no JVD, supple, symmetrical, trachea midline and thyroid not enlarged, symmetric, no tenderness/mass/nodules Back: symmetric, no curvature. ROM normal. No CVA tenderness. Lungs: clear to auscultation bilaterally Breasts: normal appearance, no masses or tenderness Heart: regular rate and rhythm, S1, S2 normal, no murmur, click, rub or gallop Abdomen: soft, non-tender; bowel sounds normal; no masses,  no organomegaly Pelvic: cervix normal in appearance, external genitalia normal, no adnexal masses or tenderness, no cervical motion tenderness, rectovaginal septum normal, uterus normal size, shape, and consistency and vagina  normal without discharge Extremities: extremities normal, atraumatic, no cyanosis or edema Pulses: 2+ and symmetric Skin: Skin color, texture, turgor normal. No rashes or lesions Lymph nodes: Cervical, supraclavicular, and axillary nodes normal. Neurologic: Alert and oriented X 3, normal strength and tone. Normal symmetric reflexes. Normal coordination and gait    Assessment:    Healthy female exam.      Plan:     See After Visit Summary for Counseling Recommendations   Keep up a regular exercise program and make sure you are eating a healthy diet Try to eat 4 servings of dairy a day, or if you are lactose intolerant take a calcium with vitamin D daily.  Your vaccines are up to date.  Discussed updated colon cancer screening guidelines. She already has her mammogram scheduled.

## 2020-02-20 LAB — CYTOLOGY - PAP
Comment: NEGATIVE
Diagnosis: NEGATIVE
High risk HPV: NEGATIVE

## 2020-02-26 ENCOUNTER — Encounter: Payer: Self-pay | Admitting: Family Medicine

## 2020-02-27 MED ORDER — POTASSIUM CHLORIDE CRYS ER 20 MEQ PO TBCR: 20.0000 meq | EXTENDED_RELEASE_TABLET | Freq: Every day | ORAL | 1 refills | Status: DC

## 2020-03-05 ENCOUNTER — Other Ambulatory Visit: Payer: Self-pay | Admitting: Family Medicine

## 2020-04-22 ENCOUNTER — Telehealth: Payer: Self-pay | Admitting: Family Medicine

## 2020-04-22 DIAGNOSIS — Z1211 Encounter for screening for malignant neoplasm of colon: Secondary | ICD-10-CM

## 2020-04-22 NOTE — Telephone Encounter (Signed)
Referral placed. Pt informed. 

## 2020-04-22 NOTE — Telephone Encounter (Signed)
Patient is requesting a routine Colonoscopy referral -- no issues

## 2020-04-22 NOTE — Telephone Encounter (Signed)
Ok for ref for screen

## 2020-04-29 ENCOUNTER — Encounter: Payer: Self-pay | Admitting: Gastroenterology

## 2020-05-18 ENCOUNTER — Other Ambulatory Visit: Payer: Self-pay | Admitting: *Deleted

## 2020-05-18 DIAGNOSIS — I10 Essential (primary) hypertension: Secondary | ICD-10-CM

## 2020-05-18 MED ORDER — AMLODIPINE BESYLATE 10 MG PO TABS: ORAL_TABLET | ORAL | 1 refills | Status: DC

## 2020-06-15 ENCOUNTER — Other Ambulatory Visit: Payer: Self-pay

## 2020-06-15 ENCOUNTER — Ambulatory Visit (AMBULATORY_SURGERY_CENTER): Payer: Managed Care, Other (non HMO) | Admitting: *Deleted

## 2020-06-15 VITALS — Ht 64.0 in | Wt 192.0 lb

## 2020-06-15 DIAGNOSIS — Z1211 Encounter for screening for malignant neoplasm of colon: Secondary | ICD-10-CM

## 2020-06-15 NOTE — Progress Notes (Signed)
Completed covid vaccines and booster  Pt is aware that care partner will wait in the car during procedure; if they feel like they will be too hot or cold to wait in the car; they may wait in the 4 th floor lobby. Patient is aware to bring only one care partner. We want them to wear a mask (we do not have any that we can provide them), practice social distancing, and we will check their temperatures when they get here.  I did remind the patient that their care partner needs to stay in the parking lot the entire time and have a cell phone available, we will call them when the pt is ready for discharge. Patient will wear mask into building.  Pt's previsit is done over the phone and all paperwork (prep instructions, blank consent form to just read over, pre-procedure acknowledgement form and stamped envelope) sent to patient   No trouble with anesthesia, denies trouble moving neck, or hx/fam hx of malignant hyperthermia per pt   No egg or soy allergy  No home oxygen use   No medications for weight loss taken emmi information given Pt denies constipation issues

## 2020-06-17 MED ORDER — SUTAB 1479-225-188 MG PO TABS: 1.0000 | ORAL_TABLET | Freq: Once | ORAL | 0 refills | Status: AC

## 2020-06-17 NOTE — Addendum Note (Signed)
Addended by: Randall Hiss B on: 06/17/2020 08:07 AM   Modules accepted: Orders

## 2020-06-24 ENCOUNTER — Encounter: Payer: Self-pay | Admitting: Gastroenterology

## 2020-06-29 ENCOUNTER — Other Ambulatory Visit: Payer: Self-pay

## 2020-06-29 ENCOUNTER — Encounter: Payer: Self-pay | Admitting: Gastroenterology

## 2020-06-29 ENCOUNTER — Ambulatory Visit (AMBULATORY_SURGERY_CENTER): Payer: Managed Care, Other (non HMO) | Admitting: Gastroenterology

## 2020-06-29 VITALS — BP 119/77 | HR 70 | Temp 97.5°F | Resp 17 | Ht 64.0 in | Wt 192.0 lb

## 2020-06-29 DIAGNOSIS — K629 Disease of anus and rectum, unspecified: Secondary | ICD-10-CM | POA: Diagnosis not present

## 2020-06-29 DIAGNOSIS — Z1211 Encounter for screening for malignant neoplasm of colon: Secondary | ICD-10-CM | POA: Diagnosis present

## 2020-06-29 MED ORDER — SODIUM CHLORIDE 0.9 % IV SOLN: 500.0000 mL | Freq: Once | INTRAVENOUS | Status: DC

## 2020-06-29 NOTE — Progress Notes (Signed)
Pt's states no medical or surgical changes since previsit or office visit.   VS taken by CW 

## 2020-06-29 NOTE — Progress Notes (Signed)
Report to PACU, RN, vss, BBS= Clear.  

## 2020-06-29 NOTE — Op Note (Addendum)
[facility] Patient Name: Anna Patel Procedure Date: 06/29/2020 8:50 AM MRN: [id number] Endoscopist: Mauri Pole , MD Age: 50 Referring MD:  Date of Birth: [birth year] Gender: Female Account #: [id number] Procedure:                Colonoscopy Indications:              Screening for colorectal malignant neoplasm Medicines:                Monitored Anesthesia Care Procedure:                Pre-Anesthesia Assessment:                           - Prior to the procedure, a History and Physical                            was performed, and patient medications and                            allergies were reviewed. The patient's tolerance of                            previous anesthesia was also reviewed. The risks                            and benefits of the procedure and the sedation                            options and risks were discussed with the patient.                            All questions were answered, and informed consent                            was obtained. Prior Anticoagulants: The patient has                            taken no previous anticoagulant or antiplatelet                            agents. ASA Grade Assessment: II - A patient with                            mild systemic disease. After reviewing the risks                            and benefits, the patient was deemed in                            satisfactory condition to undergo the procedure.                           After obtaining informed consent, the colonoscope  was passed under direct vision. Throughout the                            procedure, the patient's blood pressure, pulse, and                            oxygen saturations were monitored continuously. The                            Olympus [id number]-[id number]) Colonoscope was                            introduced through the anus and advanced to the the                            cecum,  identified by appendiceal orifice and                            ileocecal valve. The colonoscopy was performed                            without difficulty. The patient tolerated the                            procedure well. The quality of the bowel                            preparation was excellent. The ileocecal valve,                            appendiceal orifice, and rectum were photographed. Scope In: 9:03:16 AM Scope Out: 9:20:08 AM Scope Withdrawal Time: 0 hours 10 minutes 46 seconds  Total Procedure Duration: 0 hours 16 minutes 52 seconds  Findings:                 The perianal and digital rectal examinations were                            normal.                           A 7 mm polypoid lesion was found at the anus. The                            lesion was polypoid. No bleeding was present.                            Biopsies were taken with a cold forceps for                            histology.                           Non-bleeding external and internal hemorrhoids were  found during retroflexion. The hemorrhoids were                            small. Complications:            No immediate complications. Estimated Blood Loss:     Estimated blood loss was minimal. Impression:               - Likely benign polypoid lesion at the anus.                            Biopsied.                           - Non-bleeding external and internal hemorrhoids. Recommendation:           - Patient has a contact number available for                            emergencies. The signs and symptoms of potential                            delayed complications were discussed with the                            patient. Return to normal activities tomorrow.                            Written discharge instructions were provided to the                            patient.                           - Resume previous diet.                           - Continue present  medications.                           - Await pathology results.                           - Repeat colonoscopy in 5-10 years for surveillance                            based on pathology results. Mauri Pole, MD 06/29/2020 9:27:02 AM This report has been signed electronically.

## 2020-06-29 NOTE — Patient Instructions (Signed)
HANDOUTS PROVIDED ON: HEMORRHOIDS  The biopsies taken today have been sent for pathology.  The results can take 1-3 weeks to receive.  When your next colonoscopy should occur will be based on the pathology results.    You may resume your previous diet and medication schedule.  Thank you for allowing [place] to care for you today!!!   YOU HAD AN ENDOSCOPIC PROCEDURE TODAY AT [facility]:   Refer to the procedure report that was given to you for any specific questions about what was found during the examination.  If the procedure report does not answer your questions, please call your gastroenterologist to clarify.  If you requested that your care partner not be given the details of your procedure findings, then the procedure report has been included in a sealed envelope for you to review at your convenience later.  YOU SHOULD EXPECT: Some feelings of bloating in the abdomen. Passage of more gas than usual.  Walking can help get rid of the air that was put into your GI tract during the procedure and reduce the bloating. If you had a lower endoscopy (such as a colonoscopy or flexible sigmoidoscopy) you may notice spotting of blood in your stool or on the toilet paper. If you underwent a bowel prep for your procedure, you may not have a normal bowel movement for a few days.  Please Note:  You might notice some irritation and congestion in your nose or some drainage.  This is from the oxygen used during your procedure.  There is no need for concern and it should clear up in a day or so.  SYMPTOMS TO REPORT IMMEDIATELY:   Following lower endoscopy (colonoscopy or flexible sigmoidoscopy):  Excessive amounts of blood in the stool  Significant tenderness or worsening of abdominal pains  Swelling of the abdomen that is new, acute  Fever of 100F or higher  For urgent or emergent issues, a gastroenterologist can be reached at any hour by calling [phone]. Do not use MyChart messaging  for urgent concerns.    DIET:  We do recommend a small meal at first, but then you may proceed to your regular diet.  Drink plenty of fluids but you should avoid alcoholic beverages for 24 hours.  ACTIVITY:  You should plan to take it easy for the rest of today and you should NOT DRIVE or use heavy machinery until tomorrow (because of the sedation medicines used during the test).    FOLLOW UP: Our staff will call the number listed on your records Wednesday morning betwen 7:15 am and 8:15 am to check on you and address any questions or concerns that you may have regarding the information given to you following your procedure. If we do not reach you, we will leave a message.  We will attempt to reach you two times.  During this call, we will ask if you have developed any symptoms of COVID 19. If you develop any symptoms (ie: fever, flu-like symptoms, shortness of breath, cough etc.) before then, please call [phone].  If you test positive for Covid 19 in the 2 weeks post procedure, please call and report this information to [place].    If any biopsies were taken you will be contacted by phone or by letter within the next 1-3 weeks.  Please call us at [phone] if you have not heard about the biopsies in 3 weeks.    SIGNATURES/CONFIDENTIALITY: You and/or your care partner have signed paperwork which will  be entered into your electronic medical record.  These signatures attest to the fact that that the information above on your After Visit Summary has been reviewed and is understood.  Full responsibility of the confidentiality of this discharge information lies with you and/or your care-partner. 

## 2020-07-01 ENCOUNTER — Telehealth: Payer: Self-pay

## 2020-07-01 NOTE — Telephone Encounter (Signed)
  Follow up Call-  Call back number 06/29/2020  Post procedure Call Back phone  # [phone]  Permission to leave phone message Yes  Some recent data might be hidden     Patient questions:  Do you have a fever, pain , or abdominal swelling? No. Pain Score  0   Have you tolerated food without any problems? Yes.    Have you been able to return to your normal activities? Yes.    Do you have any questions about your discharge instructions: Diet   No. Medications  No. Follow up visit  No.  Do you have questions or concerns about your Care? No.  Actions: * If pain score is 4 or above: No action needed, pain <4.  1. Have you developed a fever since your procedure? no  2.   Have you had an respiratory symptoms (SOB or cough) since your procedure? no  3.   Have you tested positive for COVID 19 since your procedure no  4.   Have you had any family members/close contacts diagnosed with the COVID 19 since your procedure?  no   If yes to any of these questions please route to Joylene John, RN and Joella Prince, RN

## 2020-07-15 ENCOUNTER — Telehealth: Payer: Self-pay

## 2020-07-15 NOTE — Telephone Encounter (Signed)
Spoke with the patient. Reviewed her biopsy results from the colonoscopy. 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 No evidence of cancer. She agrees to the referral to Dr [name].

## 2020-08-17 ENCOUNTER — Ambulatory Visit (HOSPITAL_COMMUNITY): Payer: Self-pay | Admitting: General Surgery

## 2020-08-17 NOTE — H&P (Signed)
The patient is a 50 year old female who presents with anal lesions. Patient recently underwent a screening colonoscopy. There was a 7 mm polypoid lesion found in the anus. 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 Patient is currently asymptomatic and denies any lesions like this in the past. She has regular cervical cancer screenings and has only had 1 abnormal screening in the past.   Past Surgical History Anna Forehand, CNA; 08/17/2020 10:22 AM) No pertinent past surgical history  Diagnostic Studies History Anna Forehand, CNA; 08/17/2020 10:22 AM) Colonoscopy within last year Mammogram within last year Pap Smear 1-5 years ago  Allergies Anna Forehand, CNA; 08/17/2020 10:23 AM) Sulfa 10 *OPHTHALMIC AGENTS* Allergies Reconciled  Medication History Anna Forehand, CNA; 08/17/2020 10:23 AM) amLODIPine Besylate (10MG  Tablet, Oral) Active. Hydroxychloroquine Sulfate (200MG  Tablet, Oral) Active. Losartan Potassium-HCTZ (100-25MG  Tablet, Oral) Active. Potassium Chloride Crys ER (20MEQ Tablet ER, Oral) Active. Potassium Chloride Crys ER (10MEQ Tablet ER, Oral) Active. Sutab (1479-225-188MG  Tablet, Oral) Active. Medications Reconciled  Social History Anna Forehand, CNA; 08/17/2020 10:22 AM) Caffeine use Carbonated beverages, Tea. No alcohol use No drug use Tobacco use Never smoker.  Family History Anna Forehand, CNA; 08/17/2020 10:22 AM) Arthritis Father, Mother. Breast Cancer Mother. Cerebrovascular Accident Mother. Hypertension Father, Mother. Thyroid problems Mother.  Pregnancy / Birth History Anna Forehand, CNA; 08/17/2020 10:22 AM) Age at menarche 59 years. Gravida 1 Irregular periods Maternal age 34-40 Para 1  Other Problems Anna Forehand, CNA; 08/17/2020 10:22 AM) Arthritis High blood pressure Kidney Stone Other disease, cancer, significant illness     Review of Systems Anna Forehand CNA; 08/17/2020 10:22  AM) General Not Present- Appetite Loss, Chills, Fatigue, Fever, Night Sweats, Weight Gain and Weight Loss. Skin Not Present- Change in Wart/Mole, Dryness, Hives, Jaundice, New Lesions, Non-Healing Wounds, Rash and Ulcer. HEENT Present- Seasonal Allergies and Wears glasses/contact lenses. Not Present- Earache, Hearing Loss, Hoarseness, Nose Bleed, Oral Ulcers, Ringing in the Ears, Sinus Pain, Sore Throat, Visual Disturbances and Yellow Eyes. Breast Not Present- Breast Mass, Breast Pain, Nipple Discharge and Skin Changes. Cardiovascular Not Present- Chest Pain, Difficulty Breathing Lying Down, Leg Cramps, Palpitations, Rapid Heart Rate, Shortness of Breath and Swelling of Extremities. Gastrointestinal Not Present- Abdominal Pain, Bloating, Bloody Stool, Change in Bowel Habits, Chronic diarrhea, Constipation, Difficulty Swallowing, Excessive gas, Gets full quickly at meals, Hemorrhoids, Indigestion, Nausea, Rectal Pain and Vomiting. Female Genitourinary Not Present- Frequency, Nocturia, Painful Urination, Pelvic Pain and Urgency. Musculoskeletal Not Present- Back Pain, Joint Pain, Joint Stiffness, Muscle Pain, Muscle Weakness and Swelling of Extremities. Neurological Not Present- Decreased Memory, Fainting, Headaches, Numbness, Seizures, Tingling, Tremor, Trouble walking and Weakness. Psychiatric Not Present- Anxiety, Bipolar, Change in Sleep Pattern, Depression, Fearful and Frequent crying. Endocrine Not Present- Cold Intolerance, Excessive Hunger, Hair Changes, Heat Intolerance, Hot flashes and New Diabetes. Hematology Not Present- Blood Thinners, Easy Bruising, Excessive bleeding, Gland problems, HIV and Persistent Infections.  Vitals Anna Reams Alston CNA; 08/17/2020 10:24 AM) 08/17/2020 10:23 AM Weight: 194.5 lb Height: 64in Body Surface Area: 1.93 m Body Mass Index: 33.39 kg/m  Temp.: 22F  Pulse: 104 (Regular)  P.OX: 97% (Room air) BP: 140/87(Sitting, Left Arm,  Standard)        Physical Exam Anna Ruff MD; 4/74/2595 10:33 AM)  General Mental Status-Alert. General Appearance-Cooperative. 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 Biopsies show AIN  grade 2. We discussed proceeding with excision of this lesion and anoscopy to evaluate for any further lesions. We have discussed the typical recovery time and postoperative symptoms. All questions were answered. Risks include bleeding, recurrence and pain.

## 2020-08-18 ENCOUNTER — Ambulatory Visit: Payer: Managed Care, Other (non HMO) | Admitting: Family Medicine

## 2020-08-18 ENCOUNTER — Encounter: Payer: Self-pay | Admitting: Family Medicine

## 2020-08-18 ENCOUNTER — Other Ambulatory Visit: Payer: Self-pay

## 2020-08-18 VITALS — BP 121/71 | HR 78 | Ht 64.0 in | Wt 195.0 lb

## 2020-08-18 DIAGNOSIS — Z23 Encounter for immunization: Secondary | ICD-10-CM

## 2020-08-18 DIAGNOSIS — I10 Essential (primary) hypertension: Secondary | ICD-10-CM | POA: Diagnosis not present

## 2020-08-18 DIAGNOSIS — R002 Palpitations: Secondary | ICD-10-CM

## 2020-08-18 DIAGNOSIS — M329 Systemic lupus erythematosus, unspecified: Secondary | ICD-10-CM | POA: Diagnosis not present

## 2020-08-18 NOTE — Progress Notes (Signed)
Established Patient Office Visit  Subjective:  Patient ID: Anna Patel, female    DOB: 10/23/1970  Age: 50 y.o. MRN: [id number]  CC:  Chief Complaint  Patient presents with  . Hypertension  . Palpitations    HPI Anna Patel presents for   Hypertension- Pt denies chest pain, SOB, dizziness, or heart palpitations.  Taking meds as directed w/o problems.  Denies medication side effects.    Pt reports that for about 1 month she feels that her heart is pounding, heart racing whether she is resting or moving,she does get some SOB. She has been monitoring her HR and it usually runs around the mid 80's however if she walks a short distance she will experience an elevated HR around 115 or so.   She said she read online that the heart rate should be in the 60s and so was concerned that the 80s might be a little too high.  Sometimes she says she feels a sensation in her chest like her heart is may be flipping.  F/U SLE - on plaquenil.    Past Medical History:  Diagnosis Date  . Blood transfusion without reported diagnosis   . Chronic steroid use   . 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 Kidney stones   . Preeclampsia    delivery 37 weeks  . SLE (systemic lupus erythematosus) ([place])     Past Surgical History:  Procedure Laterality Date  . TUBAL LIGATION      Family History  Problem Relation Age of Onset  . Heart disease Mother        In her 6's  . Hypertension Mother   . Stroke Mother        CVA x 2, in her 9s  . Breast cancer Mother        DCIS  . Goiter Mother   . Lupus Paternal Aunt        Deceased  . Colon cancer Neg Hx   . Esophageal cancer Neg Hx   . Stomach cancer Neg Hx   . Rectal cancer Neg Hx     Social History   Socioeconomic History  . Marital status: Married    Spouse name: Not on file  . Number of children: 3  . Years of education: Not on file  . Highest education level: Not on file  Occupational History  . Occupation: [id number]     Employer: [employer]   Tobacco Use  . Smoking status: Never Smoker  . Smokeless tobacco: Never Used  Vaping Use  . Vaping Use: Never used  Substance and Sexual Activity  . Alcohol use: No  . Drug use: No  . Sexual activity: Yes    Birth control/protection: None    Comment: married, adopted a sons, 1 abortion, works a English as a second language teacher, does aroebics 3 X week, trying to lose weight.  Other Topics Concern  . Not on file  Social History Narrative   Some exercise but not regular.  Works in a Product manager.    Social Determinants of Health   Financial Resource Strain: Not on file  Food Insecurity: Not on file  Transportation Needs: Not on file  Physical Activity: Not on file  Stress: Not on file  Social Connections: Not on file  Intimate Partner Violence: Not on file    Outpatient Medications Prior to Visit  Medication Sig Dispense Refill  . amLODipine (NORVASC) 10 MG tablet TAKE 1 TABLET BY MOUTH DAILY. 90 tablet 1  .  aspirin EC 81 MG tablet Take 81 mg by mouth daily.    . Calcium Carbonate-Vitamin D 500-125 MG-UNIT TABS Take by mouth 2 (two) times daily.    . hydroxychloroquine (PLAQUENIL) 200 MG tablet Take by mouth daily.    Marland Kitchen losartan-hydrochlorothiazide (HYZAAR) 100-25 MG tablet TAKE 1 TABLET BY MOUTH DAILY 90 tablet 1  . Multiple Vitamin (MULTIVITAMIN) tablet Take 1 tablet by mouth daily.    . potassium chloride SA (KLOR-CON) 20 MEQ tablet Take 1 tablet (20 mEq total) by mouth daily. 90 tablet 1   No facility-administered medications prior to visit.    Allergies  Allergen Reactions  . Sulfa Antibiotics Nausea And Vomiting    Aggravates lupus symptoms  . Sulfonamide Derivatives     Sulfa drugs trigger patient's lupsus    ROS Review of Systems    Objective:    Physical Exam Constitutional:      Appearance: She is well-developed.  HENT:     Head: Normocephalic and atraumatic.  Neck:     Comments: No carotid bruits. Cardiovascular:     Rate and Rhythm: Normal  rate and regular rhythm.     Heart sounds: Normal heart sounds.  Pulmonary:     Effort: Pulmonary effort is normal.     Breath sounds: Normal breath sounds.  Skin:    General: Skin is warm and dry.  Neurological:     Mental Status: She is alert and oriented to person, place, and time.  Psychiatric:        Behavior: Behavior normal.     BP 121/71   Pulse 78   Ht 5\' 4"  (1.626 m)   Wt 195 lb (88.5 kg)   LMP 02/05/2015 (Exact Date)   SpO2 100%   BMI 33.47 kg/m  Wt Readings from Last 3 Encounters:  08/18/20 195 lb (88.5 kg)  06/29/20 192 lb (87.1 kg)  06/15/20 192 lb (87.1 kg)     Health Maintenance Due  Topic Date Due  . Hepatitis C Screening  Never done    There are no preventive care reminders to display for this patient.  Lab Results  Component Value Date   TSH 0.75 08/18/2020   Lab Results  Component Value Date   WBC 7.9 02/19/2020   HGB 14.9 02/19/2020   HCT 45.2 (H) 02/19/2020   MCV 81.3 02/19/2020   PLT 232 02/19/2020   Lab Results  Component Value Date   NA 140 08/18/2020   K 3.4 (L) 08/18/2020   CO2 31 08/18/2020   GLUCOSE 84 08/18/2020   BUN 14 08/18/2020   CREATININE 0.73 08/18/2020   BILITOT 0.5 02/19/2020   ALKPHOS 73 03/31/2016   AST 17 02/19/2020   ALT 13 02/19/2020   PROT 7.6 02/19/2020   ALBUMIN 3.6 03/31/2016   CALCIUM 9.7 08/18/2020   Lab Results  Component Value Date   CHOL 177 02/19/2020   Lab Results  Component Value Date   HDL 49 (L) 02/19/2020   Lab Results  Component Value Date   LDLCALC 105 (H) 02/19/2020   Lab Results  Component Value Date   TRIG 134 02/19/2020   Lab Results  Component Value Date   CHOLHDL 3.6 02/19/2020   Lab Results  Component Value Date   HGBA1C 5.8 (H) 02/14/2018      Assessment & Plan:   Problem List Items Addressed This Visit      Cardiovascular and Mediastinum   ESSENTIAL HYPERTENSION, BENIGN - Primary    Well controlled. Continue  current regimen. Follow up in  6 mo        Relevant Orders   BASIC METABOLIC PANEL WITH GFR (Completed)     Other   Systemic lupus erythematosus (SLE) in adult [facility])   Relevant Orders   BASIC METABOLIC PANEL WITH GFR (Completed)    Other Visit Diagnoses    Palpitations       Relevant Orders   EKG 86-NOTR   BASIC METABOLIC PANEL WITH GFR (Completed)   Fe+TIBC+Fer (Completed)   TSH (Completed)   Need for tetanus, diphtheria, and acellular pertussis (Tdap) vaccine in patient of adolescent age or older       Relevant Orders   Tdap vaccine greater than or equal to 7yo IM (Completed)     Palpitations-it sounds like most of the palpitations are with pulses in the 80s I just gave her reassurance that this is still considered within the normal range even at rest.  Certainly if she starts seeing pulses greater than 120 I would like to know about it she says she really has not seen anything that high.  EKG to his great today and very reassuring it was normal.  Will check for anemia and electrolyte abnormality she has a prior history of iron deficiency so we will check that as well.  We will also check for thyroid abnormality if everything is normal and symptoms continue we can always consider getting a heart monitor as well as echocardiogram she will let us know.  EKG shows rates of 72 bpm, NSR, no acute changes. No change from prior in 2020.  No orders of the defined types were placed in this encounter.   Follow-up: Return in about 6 months (around 02/18/2021).     Beatrice Lecher, MD

## 2020-08-18 NOTE — Assessment & Plan Note (Signed)
Well controlled. Continue current regimen. Follow up in  6 mo  

## 2020-08-18 NOTE — Progress Notes (Signed)
Pt reports that for about 1 month she feels that her heart is pounding, heart racing whether she is resting or moving,she does get some SOB.   She has been monitoring her HR and it usually runs around the mid 80's however if she walks a short distance she will experience an elevated HR around 115 or so.

## 2020-08-19 ENCOUNTER — Encounter: Payer: Self-pay | Admitting: Family Medicine

## 2020-08-19 LAB — TSH: TSH: 0.75 mIU/L

## 2020-08-19 LAB — BASIC METABOLIC PANEL WITH GFR
BUN: 14 mg/dL (ref 7–25)
CO2: 31 mmol/L (ref 20–32)
Calcium: 9.7 mg/dL (ref 8.6–10.2)
Chloride: 102 mmol/L (ref 98–110)
Creat: 0.73 mg/dL (ref 0.50–1.10)
GFR, Est African American: 114 mL/min/{1.73_m2} (ref >=60)
GFR, Est Non African American: 98 mL/min/{1.73_m2} (ref >=60)
Glucose, Bld: 84 mg/dL (ref 65–139)
Potassium: 3.4 mmol/L — ABNORMAL LOW (ref 3.5–5.3)
Sodium: 140 mmol/L (ref 135–146)

## 2020-08-19 LAB — IRON,TIBC AND FERRITIN PANEL
%SAT: 32 % (calc) (ref 16–45)
Ferritin: 75 ng/mL (ref 16–232)
Iron: 70 ug/dL (ref 40–190)
TIBC: 222 mcg/dL (calc) — ABNORMAL LOW (ref 250–450)

## 2020-09-21 ENCOUNTER — Other Ambulatory Visit: Payer: Self-pay

## 2020-09-21 ENCOUNTER — Other Ambulatory Visit (HOSPITAL_COMMUNITY)
Admission: RE | Admit: 2020-09-21 | Discharge: 2020-09-21 | Disposition: A | Discharge status: Home / Self Care | Payer: Managed Care, Other (non HMO) | Source: Ambulatory Visit | Attending: General Surgery | Admitting: General Surgery

## 2020-09-21 ENCOUNTER — Encounter (HOSPITAL_BASED_OUTPATIENT_CLINIC_OR_DEPARTMENT_OTHER): Payer: Self-pay | Admitting: General Surgery

## 2020-09-21 DIAGNOSIS — Z20822 Contact with and (suspected) exposure to covid-19: Secondary | ICD-10-CM | POA: Insufficient documentation

## 2020-09-21 DIAGNOSIS — Z01812 Encounter for preprocedural laboratory examination: Secondary | ICD-10-CM | POA: Diagnosis not present

## 2020-09-21 NOTE — Progress Notes (Signed)
Spoke w/ via phone for pre-op interview---pt Lab needs dos----   I stat, pt states last menustral cycle 2016, but has not had lab work to confirm postmenopausal, order for urine poct entered in epic ask mda day of surgery if urine poct needed            Lab results------ekg 08-18-2020 epic COVID test ------09-21-2020 850 Arrive at -------830 am 09-24-2020 NPO after MN NO Solid Food.  Clear liquids from MN until---730 am then npo Med rec completed Medications to take morning of surgery -----amlodipine, plaquenil Diabetic medication -----n/a Patient instructed to bring photo id and insurance card day of surgery Patient aware to have Driver (ride ) / caregiver  Spouse kevin will stay Pre-Op special Istructions -----none Patient verbalized understanding of instructions that were given at this phone interview. Patient denies shortness of breath, chest pain, fever, cough at this phone interview.  Pt stated no instructions given on 81 mg aspirin, last dose will be day before surgery 09-23-2020.

## 2020-09-22 LAB — SARS CORONAVIRUS 2 (TAT 6-24 HRS): SARS Coronavirus 2: NEGATIVE

## 2020-09-24 ENCOUNTER — Ambulatory Visit (HOSPITAL_BASED_OUTPATIENT_CLINIC_OR_DEPARTMENT_OTHER): Payer: Managed Care, Other (non HMO) | Admitting: Anesthesiology

## 2020-09-24 ENCOUNTER — Ambulatory Visit (HOSPITAL_BASED_OUTPATIENT_CLINIC_OR_DEPARTMENT_OTHER)
Admission: RE | Admit: 2020-09-24 | Discharge: 2020-09-24 | Disposition: A | Discharge status: Home / Self Care | Payer: Managed Care, Other (non HMO) | Attending: General Surgery | Admitting: General Surgery

## 2020-09-24 ENCOUNTER — Encounter (HOSPITAL_BASED_OUTPATIENT_CLINIC_OR_DEPARTMENT_OTHER)
Admission: RE | Disposition: A | Discharge status: Home / Self Care | Payer: Self-pay | Source: Home / Self Care | Attending: General Surgery

## 2020-09-24 ENCOUNTER — Encounter (HOSPITAL_BASED_OUTPATIENT_CLINIC_OR_DEPARTMENT_OTHER): Payer: Self-pay | Admitting: General Surgery

## 2020-09-24 DIAGNOSIS — Z823 Family history of stroke: Secondary | ICD-10-CM | POA: Insufficient documentation

## 2020-09-24 DIAGNOSIS — Z882 Allergy status to sulfonamides status: Secondary | ICD-10-CM | POA: Diagnosis not present

## 2020-09-24 DIAGNOSIS — Z8261 Family history of arthritis: Secondary | ICD-10-CM | POA: Insufficient documentation

## 2020-09-24 DIAGNOSIS — Z8349 Family history of other endocrine, nutritional and metabolic diseases: Secondary | ICD-10-CM | POA: Diagnosis not present

## 2020-09-24 DIAGNOSIS — I10 Essential (primary) hypertension: Secondary | ICD-10-CM | POA: Diagnosis not present

## 2020-09-24 DIAGNOSIS — Z79899 Other long term (current) drug therapy: Secondary | ICD-10-CM | POA: Diagnosis not present

## 2020-09-24 DIAGNOSIS — Z8249 Family history of ischemic heart disease and other diseases of the circulatory system: Secondary | ICD-10-CM | POA: Insufficient documentation

## 2020-09-24 DIAGNOSIS — Z803 Family history of malignant neoplasm of breast: Secondary | ICD-10-CM | POA: Diagnosis not present

## 2020-09-24 DIAGNOSIS — K6282 Dysplasia of anus: Secondary | ICD-10-CM | POA: Diagnosis present

## 2020-09-24 HISTORY — PX: RECTAL EXAM UNDER ANESTHESIA: SHX6399

## 2020-09-24 HISTORY — DX: Personal history of urinary calculi: Z87.442

## 2020-09-24 HISTORY — DX: Presence of spectacles and contact lenses: Z97.3

## 2020-09-24 HISTORY — PX: ANAL INTRAEPITHELIAL NEOPLASIA EXCISION: SHX5241

## 2020-09-24 HISTORY — DX: Essential (primary) hypertension: I10

## 2020-09-24 HISTORY — DX: Dysplasia of anus: K62.82

## 2020-09-24 LAB — POCT I-STAT, CHEM 8
BUN: 7 mg/dL (ref 6–20)
Calcium, Ion: 1.26 mmol/L (ref 1.15–1.40)
Chloride: 102 mmol/L (ref 98–111)
Creatinine, Ser: 0.8 mg/dL (ref 0.44–1.00)
Glucose, Bld: 95 mg/dL (ref 70–99)
HCT: 47 % — ABNORMAL HIGH (ref 36.0–46.0)
Hemoglobin: 16 g/dL — ABNORMAL HIGH (ref 12.0–15.0)
Potassium: 3 mmol/L — ABNORMAL LOW (ref 3.5–5.1)
Sodium: 144 mmol/L (ref 135–145)
TCO2: 27 mmol/L (ref 22–32)

## 2020-09-24 SURGERY — EXCISION, NEOPLASM, INTRAEPITHELIAL, ANUS
Anesthesia: Monitor Anesthesia Care | Site: Rectum

## 2020-09-24 MED ORDER — ACETIC ACID 5 % SOLN
Status: DC | PRN
  Administered 2020-09-24: 1 via TOPICAL

## 2020-09-24 MED ORDER — FENTANYL CITRATE (PF) 100 MCG/2ML IJ SOLN
INTRAMUSCULAR | Status: DC | PRN
  Administered 2020-09-24: 25 ug via INTRAVENOUS
  Administered 2020-09-24: 50 ug via INTRAVENOUS

## 2020-09-24 MED ORDER — FENTANYL CITRATE (PF) 100 MCG/2ML IJ SOLN: 25.0000 ug | INTRAMUSCULAR | Status: DC | PRN

## 2020-09-24 MED ORDER — LACTATED RINGERS IV SOLN: INTRAVENOUS | Status: DC

## 2020-09-24 MED ORDER — ONDANSETRON HCL 4 MG/2ML IJ SOLN
INTRAMUSCULAR | Status: AC
  Filled 2020-09-24: qty 2

## 2020-09-24 MED ORDER — BUPIVACAINE-EPINEPHRINE 0.5% -1:200000 IJ SOLN
INTRAMUSCULAR | Status: DC | PRN
  Administered 2020-09-24: 30 mL

## 2020-09-24 MED ORDER — ACETAMINOPHEN 500 MG PO TABS
ORAL_TABLET | ORAL | Status: AC
  Filled 2020-09-24: qty 2

## 2020-09-24 MED ORDER — MIDAZOLAM HCL 2 MG/2ML IJ SOLN
INTRAMUSCULAR | Status: AC
  Filled 2020-09-24: qty 2

## 2020-09-24 MED ORDER — LIDOCAINE 5 % EX OINT
TOPICAL_OINTMENT | CUTANEOUS | Status: DC | PRN
  Administered 2020-09-24: 1

## 2020-09-24 MED ORDER — TRAMADOL HCL 50 MG PO TABS: 50.0000 mg | ORAL_TABLET | Freq: Four times a day (QID) | ORAL | 0 refills | Status: DC | PRN

## 2020-09-24 MED ORDER — SODIUM CHLORIDE 0.9% FLUSH: 3.0000 mL | Freq: Two times a day (BID) | INTRAVENOUS | Status: DC

## 2020-09-24 MED ORDER — ONDANSETRON HCL 4 MG/2ML IJ SOLN
INTRAMUSCULAR | Status: DC | PRN
  Administered 2020-09-24: 4 mg via INTRAVENOUS

## 2020-09-24 MED ORDER — PROPOFOL 500 MG/50ML IV EMUL
INTRAVENOUS | Status: DC | PRN
  Administered 2020-09-24: 200 ug/kg/min via INTRAVENOUS

## 2020-09-24 MED ORDER — PROMETHAZINE HCL 25 MG/ML IJ SOLN: 6.2500 mg | INTRAMUSCULAR | Status: DC | PRN

## 2020-09-24 MED ORDER — OXYCODONE HCL 5 MG PO TABS: 5.0000 mg | ORAL_TABLET | Freq: Once | ORAL | Status: DC | PRN

## 2020-09-24 MED ORDER — OXYCODONE HCL 5 MG/5ML PO SOLN: 5.0000 mg | Freq: Once | ORAL | Status: DC | PRN

## 2020-09-24 MED ORDER — FENTANYL CITRATE (PF) 100 MCG/2ML IJ SOLN
INTRAMUSCULAR | Status: AC
  Filled 2020-09-24: qty 2

## 2020-09-24 MED ORDER — PROPOFOL 10 MG/ML IV BOLUS
INTRAVENOUS | Status: DC | PRN
  Administered 2020-09-24: 30 mg via INTRAVENOUS

## 2020-09-24 MED ORDER — MIDAZOLAM HCL 5 MG/5ML IJ SOLN
INTRAMUSCULAR | Status: DC | PRN
  Administered 2020-09-24: 2 mg via INTRAVENOUS

## 2020-09-24 MED ORDER — ACETAMINOPHEN 500 MG PO TABS
1000.0000 mg | ORAL_TABLET | ORAL | Status: AC
  Administered 2020-09-24: 1000 mg via ORAL

## 2020-09-24 SURGICAL SUPPLY — 56 items
APL SKNCLS STERI-STRIP NONHPOA (GAUZE/BANDAGES/DRESSINGS) ×1
BENZOIN TINCTURE PRP APPL 2/3 (GAUZE/BANDAGES/DRESSINGS) ×3 IMPLANT
BLADE EXTENDED COATED 6.5IN (ELECTRODE) IMPLANT
BLADE HEX COATED 2.75 (ELECTRODE) ×1 IMPLANT
BLADE SURG 10 STRL SS (BLADE) ×2 IMPLANT
BRIEF STRETCH FOR OB PAD LRG (UNDERPADS AND DIAPERS) ×2 IMPLANT
CANISTER SUCT 3000ML PPV (MISCELLANEOUS) ×1 IMPLANT
COVER BACK TABLE 60X90IN (DRAPES) ×2 IMPLANT
COVER MAYO STAND STRL (DRAPES) ×2 IMPLANT
COVER WAND RF STERILE (DRAPES) ×1 IMPLANT
DECANTER SPIKE VIAL GLASS SM (MISCELLANEOUS) ×2 IMPLANT
DRAPE HYSTEROSCOPY (MISCELLANEOUS) IMPLANT
DRAPE LAPAROTOMY 100X72 PEDS (DRAPES) ×2 IMPLANT
DRAPE SHEET LG 3/4 BI-LAMINATE (DRAPES) IMPLANT
DRAPE UTILITY XL STRL (DRAPES) ×2 IMPLANT
DRSG PAD ABDOMINAL 8X10 ST (GAUZE/BANDAGES/DRESSINGS) ×2 IMPLANT
ELECT REM PT RETURN 9FT ADLT (ELECTROSURGICAL) ×2
ELECTRODE REM PT RTRN 9FT ADLT (ELECTROSURGICAL) ×1 IMPLANT
GAUZE SPONGE 4X4 12PLY STRL (GAUZE/BANDAGES/DRESSINGS) ×2 IMPLANT
GAUZE SPONGE 4X4 12PLY STRL LF (GAUZE/BANDAGES/DRESSINGS) ×1 IMPLANT
GLOVE SURG ENC MOIS LTX SZ6.5 (GLOVE) ×2 IMPLANT
GLOVE SURG UNDER POLY LF SZ7 (GLOVE) ×2 IMPLANT
GOWN STRL REUS W/TWL XL LVL3 (GOWN DISPOSABLE) ×2 IMPLANT
HYDROGEN PEROXIDE 16OZ (MISCELLANEOUS) ×1 IMPLANT
IV CATH 14GX2 1/4 (CATHETERS) ×1 IMPLANT
IV CATH 18G SAFETY (IV SOLUTION) ×1 IMPLANT
KIT SIGMOIDOSCOPE (SET/KITS/TRAYS/PACK) IMPLANT
KIT TURNOVER CYSTO (KITS) ×2 IMPLANT
LEGGING LITHOTOMY PAIR STRL (DRAPES) IMPLANT
LOOP VESSEL MAXI BLUE (MISCELLANEOUS) IMPLANT
NDL SAFETY ECLIPSE 18X1.5 (NEEDLE) IMPLANT
NEEDLE HYPO 18GX1.5 SHARP (NEEDLE)
NEEDLE HYPO 22GX1.5 SAFETY (NEEDLE) ×2 IMPLANT
NS IRRIG 500ML POUR BTL (IV SOLUTION) ×2 IMPLANT
PACK BASIN DAY SURGERY FS (CUSTOM PROCEDURE TRAY) ×2 IMPLANT
PAD ARMBOARD 7.5X6 YLW CONV (MISCELLANEOUS) IMPLANT
PENCIL SMOKE EVACUATOR (MISCELLANEOUS) ×2 IMPLANT
SPONGE HEMORRHOID 8X3CM (HEMOSTASIS) IMPLANT
SPONGE SURGIFOAM ABS GEL 12-7 (HEMOSTASIS) IMPLANT
SUCTION FRAZIER HANDLE 10FR (MISCELLANEOUS)
SUCTION TUBE FRAZIER 10FR DISP (MISCELLANEOUS) IMPLANT
SUT CHROMIC 2 0 SH (SUTURE) ×1 IMPLANT
SUT CHROMIC 3 0 SH 27 (SUTURE) ×1 IMPLANT
SUT ETHIBOND 0 (SUTURE) IMPLANT
SUT MON AB 3-0 SH 27 (SUTURE)
SUT MON AB 3-0 SH27 (SUTURE) ×1 IMPLANT
SUT VIC AB 2-0 SH 27 (SUTURE)
SUT VIC AB 2-0 SH 27XBRD (SUTURE) IMPLANT
SUT VIC AB 3-0 SH 18 (SUTURE) IMPLANT
SUT VIC AB 3-0 SH 27 (SUTURE)
SUT VIC AB 3-0 SH 27XBRD (SUTURE) IMPLANT
SYR CONTROL 10ML LL (SYRINGE) ×2 IMPLANT
TOWEL OR 17X26 10 PK STRL BLUE (TOWEL DISPOSABLE) ×2 IMPLANT
TRAY DSU PREP LF (CUSTOM PROCEDURE TRAY) ×2 IMPLANT
TUBE CONNECTING 12X1/4 (SUCTIONS) ×2 IMPLANT
YANKAUER SUCT BULB TIP NO VENT (SUCTIONS) ×2 IMPLANT

## 2020-09-24 NOTE — Transfer of Care (Signed)
Immediate Anesthesia Transfer of Care Note  Patient: Anna Patel  Procedure(s) Performed: EXCISION ANAL INTRAEPITHELIAL NEOPLASIA (N/A ) RECTAL EXAM UNDER ANESTHESIA (N/A )  Patient Location: PACU  Anesthesia Type:MAC  Level of Consciousness: awake, alert  and oriented  Airway & Oxygen Therapy: Patient Spontanous Breathing and Patient connected to face mask oxygen  Post-op Assessment: Report given to RN and Post -op Vital signs reviewed and stable  Post vital signs: Reviewed and stable  Last Vitals:  Vitals Value Taken Time  BP 112/58 09/24/20 1015  Temp    Pulse 76 09/24/20 1016  Resp 17 09/24/20 1016  SpO2 99 % 09/24/20 1016  Vitals shown include unvalidated device data.  Last Pain:  Vitals:   09/24/20 1015  TempSrc:   PainSc: 0-No pain      Patients Stated Pain Goal: 6 (58/59/29 2446)  Complications: No complications documented.

## 2020-09-24 NOTE — H&P (Signed)
The patient is a 50 year old female who presents with anal lesions. Patient recently underwent a screening colonoscopy. There was a 7 mm polypoid lesion found in the anus. 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 Patient is currently asymptomatic and denies any lesions like this in the past. She has regular cervical cancer screenings and has only had 1 abnormal screening in the past.   Past Surgical History  No pertinent past surgical history  Diagnostic Studies History  Colonoscopy within last year Mammogram within last year Pap Smear 1-5 years ago  Allergies  Sulfa 10 *OPHTHALMIC AGENTS* Allergies Reconciled  Medication History  amLODIPine Besylate (10MG  Tablet, Oral) Active. Hydroxychloroquine Sulfate (200MG  Tablet, Oral) Active. Losartan Potassium-HCTZ (100-25MG  Tablet, Oral) Active. Potassium Chloride Crys ER (20MEQ Tablet ER, Oral) Active. Potassium Chloride Crys ER (10MEQ Tablet ER, Oral) Active. Sutab (1479-225-188MG  Tablet, Oral) Active. Medications Reconciled  Social History [name], CNA; 08/17/2020 10:22 AM) Caffeine use Carbonated beverages, Tea. No alcohol use No drug use Tobacco use Never smoker.  Family History [name], CNA; 08/17/2020 10:22 AM) Arthritis Father, Mother. Breast Cancer Mother. Cerebrovascular Accident Mother. Hypertension Father, Mother. Thyroid problems Mother.  Pregnancy / Birth History [name], CNA; 08/17/2020 10:22 AM) Age at menarche 75 years. Gravida 1 Irregular periods Maternal age 11-40 Para 1  Other Problems [name], CNA; 08/17/2020 10:22 AM) Arthritis High blood pressure Kidney Stone Other disease, cancer, significant illness     Review of Systems  General Not Present- Appetite Loss, Chills, Fatigue, Fever, Night Sweats, Weight Gain and Weight Loss. Skin Not Present- Change in Wart/Mole, Dryness, Hives, Jaundice, New Lesions, Non-Healing  Wounds, Rash and Ulcer. HEENT Present- Seasonal Allergies and Wears glasses/contact lenses. Not Present- Earache, Hearing Loss, Hoarseness, Nose Bleed, Oral Ulcers, Ringing in the Ears, Sinus Pain, Sore Throat, Visual Disturbances and Yellow Eyes. Breast Not Present- Breast Mass, Breast Pain, Nipple Discharge and Skin Changes. Cardiovascular Not Present- Chest Pain, Difficulty Breathing Lying Down, Leg Cramps, Palpitations, Rapid Heart Rate, Shortness of Breath and Swelling of Extremities. Gastrointestinal Not Present- Abdominal Pain, Bloating, Bloody Stool, Change in Bowel Habits, Chronic diarrhea, Constipation, Difficulty Swallowing, Excessive gas, Gets full quickly at meals, Hemorrhoids, Indigestion, Nausea, Rectal Pain and Vomiting. Female Genitourinary Not Present- Frequency, Nocturia, Painful Urination, Pelvic Pain and Urgency. Musculoskeletal Not Present- Back Pain, Joint Pain, Joint Stiffness, Muscle Pain, Muscle Weakness and Swelling of Extremities. Neurological Not Present- Decreased Memory, Fainting, Headaches, Numbness, Seizures, Tingling, Tremor, Trouble walking and Weakness. Psychiatric Not Present- Anxiety, Bipolar, Change in Sleep Pattern, Depression, Fearful and Frequent crying. Endocrine Not Present- Cold Intolerance, Excessive Hunger, Hair Changes, Heat Intolerance, Hot flashes and New Diabetes. Hematology Not Present- Blood Thinners, Easy Bruising, Excessive bleeding, Gland problems, HIV and Persistent Infections.  BP (!) 134/92   Pulse 76   Temp 97.9 F (36.6 C) (Oral)   Resp 17   Ht 5\' 4"  (1.626 m)   Wt 87.5 kg   LMP 02/05/2015 (Exact Date)   SpO2 100%   BMI 33.09 kg/m     Physical Exam   General Mental Status-Alert. General Appearance-Cooperative. CV: RRR Lungs: CTA Abd: soft   Assessment & Plan   AIN GRADE II (I94.85) Impression: 50 year old female status post screening colonoscopy, which showed a mass in the anal canal. Biopsies show AIN grade  2. We discussed proceeding with excision of this lesion and anoscopy to evaluate for any further lesions. We have discussed the typical recovery time and postoperative symptoms. All questions were answered. Risks include  bleeding, recurrence and pain.  Rosario Adie, MD  [facility]

## 2020-09-24 NOTE — Op Note (Signed)
09/24/2020  10:04 AM  PATIENT:  Anna Patel  50 y.o. female  Patient Care Team: Hali Marry, MD as PCP - General (Family Medicine) Darci Needle, MD (Rheumatology)  PRE-OPERATIVE DIAGNOSIS:  anal intraepithelial neoplasia  POST-OPERATIVE DIAGNOSIS:  anal intraepithelial neoplasia  PROCEDURE:  HIGH RESOLUTION ANOSCOPY BIOPSY ANAL INTRAEPITHELIAL NEOPLASIA    Surgeon(s): [name], MD  ASSISTANT: none   ANESTHESIA:   local and MAC  SPECIMEN:  Source of Specimen:  L lateral anal canal, Anterior midline anal canal and Right posterior anal canal  DISPOSITION OF SPECIMEN:  PATHOLOGY  COUNTS:  YES  PLAN OF CARE: Discharge to home after PACU  PATIENT DISPOSITION:  PACU - hemodynamically stable.  INDICATION: 50 year old female who underwent recent colonoscopy and was noted to have a perianal lesion.  Biopsies show AIN grade 2.  She is here today for high resolution anoscopy and excision versus biopsy   OR FINDINGS: No discrete lesion noted.  Diffuse acetowhite staining noted  DESCRIPTION: the patient was identified in the preoperative holding area and taken to the OR where they were laid on the operating room table.  MAC anesthesia was induced without difficulty. The patient was then positioned in prone jackknife position with buttocks gently taped apart.  The patient was then prepped and draped in usual sterile fashion.  SCDs were noted to be in place prior to the initiation of anesthesia. A surgical timeout was performed indicating the correct patient, procedure, positioning and need for preoperative antibiotics.  A rectal block was performed using Marcaine with epinephrine.    I began with a digital rectal exam.  There were no palpable masses or mucosal lesions noted.  I then placed a Hill-Ferguson anoscope into the anal canal and evaluated this completely.  There were no discrete lesions noted throughout the anal canal.  The anal tissue was very friable.  I  placed an acetic acid soaked sponge into the anal canal and allowed this to sit for approximately 2 minutes.  We then performed anoscopy using the operative microscope to evaluate the tissue on a deeper level.  3 areas of abnormality were identified.  These were biopsied using scissors.  Biopsy sites were closed using 3-0 chromic sutures.  Hemostasis was good.  We performed biopsies of the left lateral, anterior midline and right posterior anal canal.  These were sent to pathology for further examination.  Lidocaine ointment and a dressing were applied.  The patient was awakened from anesthesia and sent to the postanesthesia care unit in stable condition.  All counts were correct per operating room staff.

## 2020-09-24 NOTE — Discharge Instructions (Addendum)
°  Post Anesthesia Home Care Instructions ° °Activity: °Get plenty of rest for the remainder of the day. A responsible individual must stay with you for 24 hours following the procedure.  °For the next 24 hours, DO NOT: °-Drive a car °-Operate machinery °-Drink alcoholic beverages °-Take any medication unless instructed by your physician °-Make any legal decisions or sign important papers. ° °Meals: °Start with liquid foods such as gelatin or soup. Progress to regular foods as tolerated. Avoid greasy, spicy, heavy foods. If nausea and/or vomiting occur, drink only clear liquids until the nausea and/or vomiting subsides. Call your physician if vomiting continues. ° °Special Instructions/Symptoms: °Your throat may feel dry or sore from the anesthesia or the breathing tube placed in your throat during surgery. If this causes discomfort, gargle with warm salt water. The discomfort should disappear within 24 hours. ° °If you had a scopolamine patch placed behind your ear for the management of post- operative nausea and/or vomiting: ° °1. The medication in the patch is effective for 72 hours, after which it should be removed.  Wrap patch in a tissue and discard in the trash. Wash hands thoroughly with soap and water. °2. You may remove the patch earlier than 72 hours if you experience unpleasant side effects which may include dry mouth, dizziness or visual disturbances. °3. Avoid touching the patch. Wash your hands with soap and water after contact with the patch. °   °Beginning the day after surgery: ° °You may sit in a tub of warm water 2-3 times a day to relieve discomfort. ° °Eat a regular diet high in fiber.  Avoid foods that give you constipation or diarrhea.  Avoid foods that are difficult to digest, such as seeds, nuts, corn or popcorn. ° °Do not go any longer than 2 days without a bowel movement.  You may take a dose of Milk of Magnesia if you become constipated.   ° °Drink 6-8 glasses of water daily. ° °Walking  is encouraged.  Avoid strenuous activity and heavy lifting for one month after surgery.   ° °Call the office if you have any questions or concerns.  Call immediately if you develop: ° °· Excessive rectal bleeding (more than a cup or passing large clots) °· Increased discomfort °· Fever greater than 100 F °· Difficulty urinating ° °

## 2020-09-24 NOTE — Anesthesia Postprocedure Evaluation (Signed)
Anesthesia Post Note  Patient: Anna Patel  Procedure(s) Performed: EXCISION ANAL INTRAEPITHELIAL NEOPLASIA (N/A Rectum) RECTAL EXAM UNDER ANESTHESIA (N/A Rectum)     Patient location during evaluation: PACU Anesthesia Type: MAC Level of consciousness: awake and alert Pain management: pain level controlled Vital Signs Assessment: post-procedure vital signs reviewed and stable Respiratory status: spontaneous breathing, nonlabored ventilation and respiratory function stable Cardiovascular status: stable and blood pressure returned to baseline Anesthetic complications: no   No complications documented.  Last Vitals:  Vitals:   09/24/20 1030 09/24/20 1053  BP: 116/63 115/73  Pulse: 80 75  Resp: 18 18  Temp:    SpO2: 100% 100%    Last Pain:  Vitals:   09/24/20 1030  TempSrc:   PainSc: 0-No pain                 Audry Pili

## 2020-09-24 NOTE — Anesthesia Preprocedure Evaluation (Addendum)
Anesthesia Evaluation  Patient identified by MRN, date of birth, ID band Patient awake    Reviewed: Allergy & Precautions, NPO status , Patient's Chart, lab work & pertinent test results  History of Anesthesia Complications Negative for: history of anesthetic complications  Airway Mallampati: II  TM Distance: >3 FB Neck ROM: Full    Dental  (+) Dental Advisory Given, Teeth Intact   Pulmonary neg pulmonary ROS,    Pulmonary exam normal        Cardiovascular hypertension, Pt. on medications Normal cardiovascular exam     Neuro/Psych negative neurological ROS  negative psych ROS   GI/Hepatic negative GI ROS, Neg liver ROS,   Endo/Other   Obesity   Renal/GU negative Renal ROS     Musculoskeletal  (+) Arthritis , Rheumatoid disorders,    Abdominal   Peds  Hematology negative hematology ROS (+)   Anesthesia Other Findings SLE Covid test negative   Reproductive/Obstetrics  s/p tubal ligation                             Anesthesia Physical Anesthesia Plan  ASA: II  Anesthesia Plan: MAC   Post-op Pain Management:    Induction: Intravenous  PONV Risk Score and Plan: 2 and Propofol infusion and Treatment may vary due to age or medical condition  Airway Management Planned: Natural Airway and Simple Face Mask  Additional Equipment: None  Intra-op Plan:   Post-operative Plan:   Informed Consent: I have reviewed the patients History and Physical, chart, labs and discussed the procedure including the risks, benefits and alternatives for the proposed anesthesia with the patient or authorized representative who has indicated his/her understanding and acceptance.       Plan Discussed with: CRNA and Anesthesiologist  Anesthesia Plan Comments:        Anesthesia Quick Evaluation

## 2020-09-25 ENCOUNTER — Encounter (HOSPITAL_BASED_OUTPATIENT_CLINIC_OR_DEPARTMENT_OTHER): Payer: Self-pay | Admitting: General Surgery

## 2020-09-28 LAB — SURGICAL PATHOLOGY

## 2020-10-14 ENCOUNTER — Other Ambulatory Visit: Payer: Self-pay | Admitting: *Deleted

## 2020-10-14 MED ORDER — LOSARTAN POTASSIUM-HCTZ 100-25 MG PO TABS: 1.0000 | ORAL_TABLET | Freq: Every day | ORAL | 1 refills | Status: DC

## 2020-11-03 ENCOUNTER — Other Ambulatory Visit: Payer: Self-pay | Admitting: *Deleted

## 2020-11-03 MED ORDER — POTASSIUM CHLORIDE CRYS ER 20 MEQ PO TBCR: 20.0000 meq | EXTENDED_RELEASE_TABLET | Freq: Every day | ORAL | 1 refills | Status: DC

## 2021-01-14 ENCOUNTER — Encounter: Payer: Self-pay | Admitting: Family Medicine

## 2021-01-14 ENCOUNTER — Ambulatory Visit: Payer: Managed Care, Other (non HMO) | Admitting: Family Medicine

## 2021-01-14 ENCOUNTER — Other Ambulatory Visit: Payer: Self-pay

## 2021-01-14 VITALS — BP 135/85 | HR 74 | Ht 64.0 in | Wt 193.4 lb

## 2021-01-14 DIAGNOSIS — R300 Dysuria: Secondary | ICD-10-CM | POA: Diagnosis not present

## 2021-01-14 LAB — POCT URINALYSIS DIP (CLINITEK)
Bilirubin, UA: NEGATIVE
Blood, UA: NEGATIVE
Glucose, UA: NEGATIVE mg/dL
Ketones, POC UA: NEGATIVE mg/dL
Nitrite, UA: NEGATIVE
POC PROTEIN,UA: 30 — AB
Spec Grav, UA: >=1.03 — AB (ref 1.010–1.025)
Urobilinogen, UA: 0.2 E.U./dL
pH, UA: 7 (ref 5.0–8.0)

## 2021-01-14 MED ORDER — NITROFURANTOIN MONOHYD MACRO 100 MG PO CAPS: 100.0000 mg | ORAL_CAPSULE | Freq: Two times a day (BID) | ORAL | 0 refills | Status: DC

## 2021-01-14 NOTE — Progress Notes (Signed)
Acute Office Visit  Subjective:    Patient ID: Anna Patel, female    DOB: 08-26-1970, 50 y.o.   MRN: [id number]  No chief complaint on file.   HPI Patient is in today for 1 month of burning with urination.  No hematuria no significant back pain.  She has not tried anything over-the-counter.  She has had some burning with intercourse as well.  She has been trying to push more fluids. No vaginal d/c. No fever or chills.   Past Medical History:  Diagnosis Date   Anal intraepithelial neoplasia    Blood transfusion without reported diagnosis age 61   due to lupus    History of kidney stones    History of pre-eclampsia 2010   Hypertension    JRA (juvenile rheumatoid arthritis) ([place])    SLE (systemic lupus erythematosus) ([place])    Wears glasses    for reading    Past Surgical History:  Procedure Laterality Date   ANAL INTRAEPITHELIAL NEOPLASIA EXCISION N/A 09/24/2020   Procedure: EXCISION ANAL INTRAEPITHELIAL NEOPLASIA;  Surgeon: [name], MD;  Location: [facility];  Service: General;  Laterality: N/A;   RECTAL EXAM UNDER ANESTHESIA N/A 09/24/2020   Procedure: RECTAL EXAM UNDER ANESTHESIA;  Surgeon: [name], MD;  Location: [facility];  Service: General;  Laterality: N/A;   TUBAL LIGATION  2010    Family History  Problem Relation Age of Onset   Heart disease Mother        In her 43's   Hypertension Mother    Stroke Mother        CVA x 2, in her 69s   Breast cancer Mother        DCIS   Goiter Mother    Lupus Paternal Aunt        Deceased   Colon cancer Neg Hx    Esophageal cancer Neg Hx    Stomach cancer Neg Hx    Rectal cancer Neg Hx     Social History   Socioeconomic History   Marital status: Married    Spouse name: Not on file   Number of children: 3   Years of education: Not on file   Highest education level: Not on file  Occupational History   Occupation: [phone]    Employer: Industrial/product designer   Tobacco  Use   Smoking status: Never   Smokeless tobacco: Never  Vaping Use   Vaping Use: Never used  Substance and Sexual Activity   Alcohol use: No   Drug use: No   Sexual activity: Yes    Birth control/protection: None    Comment: married, adopted a sons, 1 abortion, works a English as a second language teacher, does aroebics 3 X week, trying to lose weight.  Other Topics Concern   Not on file  Social History Narrative   Some exercise but not regular.  Works in a Product manager.    Social Determinants of Health   Financial Resource Strain: Not on file  Food Insecurity: Not on file  Transportation Needs: Not on file  Physical Activity: Not on file  Stress: Not on file  Social Connections: Not on file  Intimate Partner Violence: Not on file    Outpatient Medications Prior to Visit  Medication Sig Dispense Refill   amLODipine (NORVASC) 10 MG tablet TAKE 1 TABLET BY MOUTH DAILY. 90 tablet 1   aspirin EC 81 MG tablet Take 81 mg by mouth daily.     Calcium Carbonate-Vitamin D 500-125  MG-UNIT TABS Take by mouth 2 (two) times daily.     hydroxychloroquine (PLAQUENIL) 200 MG tablet Take by mouth daily.     losartan-hydrochlorothiazide (HYZAAR) 100-25 MG tablet Take 1 tablet by mouth daily. 90 tablet 1   Multiple Vitamin (MULTIVITAMIN) tablet Take 1 tablet by mouth daily.     potassium chloride SA (KLOR-CON) 20 MEQ tablet Take 1 tablet (20 mEq total) by mouth daily. 90 tablet 1   traMADol (ULTRAM) 50 MG tablet Take 1-2 tablets (50-100 mg total) by mouth every 6 (six) hours as needed. 30 tablet 0   No facility-administered medications prior to visit.    Allergies  Allergen Reactions   Sulfa Antibiotics Nausea And Vomiting    Aggravates lupus symptoms   Sulfonamide Derivatives     Sulfa drugs trigger patient's lupsus    Review of Systems     Objective:    Physical Exam Vitals reviewed.  Constitutional:      Appearance: She is well-developed.  HENT:     Head: Normocephalic and atraumatic.  Eyes:      Conjunctiva/sclera: Conjunctivae normal.  Cardiovascular:     Rate and Rhythm: Normal rate.  Pulmonary:     Effort: Pulmonary effort is normal.  Musculoskeletal:     Comments: No CVA tenderness  Skin:    General: Skin is dry.     Coloration: Skin is not pale.  Neurological:     Mental Status: She is alert and oriented to person, place, and time.  Psychiatric:        Behavior: Behavior normal.    BP 135/85 (BP Location: Left Arm, Patient Position: Sitting, Cuff Size: Large)   Pulse 74   Ht '5\' 4"'$  (1.626 m)   Wt 193 lb 6.4 oz (87.7 kg)   LMP 02/05/2015 (Exact Date)   SpO2 93%   BMI 33.20 kg/m  Wt Readings from Last 3 Encounters:  01/14/21 193 lb 6.4 oz (87.7 kg)  09/24/20 192 lb 12.8 oz (87.5 kg)  08/18/20 195 lb (88.5 kg)    Health Maintenance Due  Topic Date Due   Pneumococcal Vaccine 72-50 Years old (1 - PCV) Never done   Hepatitis C Screening  Never done   COVID-19 Vaccine (3 - Booster for Janssen series) 04/24/2020   INFLUENZA VACCINE  12/28/2020    There are no preventive care reminders to display for this patient.   Lab Results  Component Value Date   TSH 0.75 08/18/2020   Lab Results  Component Value Date   WBC 7.9 02/19/2020   HGB 16.0 (H) 09/24/2020   HCT 47.0 (H) 09/24/2020   MCV 81.3 02/19/2020   PLT 232 02/19/2020   Lab Results  Component Value Date   NA 144 09/24/2020   K 3.0 (L) 09/24/2020   CO2 31 08/18/2020   GLUCOSE 95 09/24/2020   BUN 7 09/24/2020   CREATININE 0.80 09/24/2020   BILITOT 0.5 02/19/2020   ALKPHOS 73 03/31/2016   AST 17 02/19/2020   ALT 13 02/19/2020   PROT 7.6 02/19/2020   ALBUMIN 3.6 03/31/2016   CALCIUM 9.7 08/18/2020   Lab Results  Component Value Date   CHOL 177 02/19/2020   Lab Results  Component Value Date   HDL 49 (L) 02/19/2020   Lab Results  Component Value Date   LDLCALC 105 (H) 02/19/2020   Lab Results  Component Value Date   TRIG 134 02/19/2020   Lab Results  Component Value Date   CHOLHDL  3.6 02/19/2020  Lab Results  Component Value Date   HGBA1C 5.8 (H) 02/14/2018       Assessment & Plan:   Problem List Items Addressed This Visit   None Visit Diagnoses     Dysuria    -  Primary   Relevant Orders   POCT URINALYSIS DIP (CLINITEK)   Urine Culture      Dysuria - UA only showing leuks and protein. Will send for culture.  Macrobid sent to pharmacy.  If she is not better in 1 week then please let me know.  Meds ordered this encounter  Medications   nitrofurantoin, macrocrystal-monohydrate, (MACROBID) 100 MG capsule    Sig: Take 1 capsule (100 mg total) by mouth 2 (two) times daily.    Dispense:  10 capsule    Refill:  0     Beatrice Lecher, MD

## 2021-01-16 LAB — URINE CULTURE
MICRO NUMBER:: 12264770
SPECIMEN QUALITY:: ADEQUATE

## 2021-01-30 ENCOUNTER — Other Ambulatory Visit: Payer: Self-pay | Admitting: Family Medicine

## 2021-01-30 DIAGNOSIS — I10 Essential (primary) hypertension: Secondary | ICD-10-CM

## 2021-02-19 ENCOUNTER — Ambulatory Visit (INDEPENDENT_AMBULATORY_CARE_PROVIDER_SITE_OTHER): Payer: Managed Care, Other (non HMO) | Admitting: Family Medicine

## 2021-02-19 ENCOUNTER — Encounter: Payer: Self-pay | Admitting: Family Medicine

## 2021-02-19 ENCOUNTER — Other Ambulatory Visit: Payer: Self-pay

## 2021-02-19 VITALS — BP 122/80 | HR 68 | Ht 64.0 in | Wt 191.0 lb

## 2021-02-19 DIAGNOSIS — Z0000 Encounter for general adult medical examination without abnormal findings: Secondary | ICD-10-CM | POA: Diagnosis not present

## 2021-02-19 DIAGNOSIS — Z23 Encounter for immunization: Secondary | ICD-10-CM

## 2021-02-19 DIAGNOSIS — Z1231 Encounter for screening mammogram for malignant neoplasm of breast: Secondary | ICD-10-CM

## 2021-02-19 NOTE — Progress Notes (Signed)
Subjective:     Anna Patel is a 50 y.o. female and is here for a comprehensive physical exam. The patient reports no problems.  Social History   Socioeconomic History   Marital status: Married    Spouse name: Not on file   Number of children: 3   Years of education: Not on file   Highest education level: Not on file  Occupational History   Occupation: [phone]    Employer: [employer]   Tobacco Use   Smoking status: Never   Smokeless tobacco: Never  Vaping Use   Vaping Use: Never used  Substance and Sexual Activity   Alcohol use: No   Drug use: No   Sexual activity: Yes    Birth control/protection: None    Comment: married, adopted a sons, 1 abortion, works a English as a second language teacher, does aroebics 3 X week, trying to lose weight.  Other Topics Concern   Not on file  Social History Narrative   Some exercise but not regular.  Works in a Product manager.    Social Determinants of Health   Financial Resource Strain: Not on file  Food Insecurity: Not on file  Transportation Needs: Not on file  Physical Activity: Not on file  Stress: Not on file  Social Connections: Not on file  Intimate Partner Violence: Not on file   Health Maintenance  Topic Date Due   Hepatitis C Screening  Never done   COVID-19 Vaccine (3 - Booster for Janssen series) 04/24/2020   MAMMOGRAM  02/26/2021   PAP SMEAR-Modifier  02/18/2025   COLONOSCOPY (Pts 45-75yrs Insurance coverage will need to be confirmed)  06/29/2030   TETANUS/TDAP  08/19/2030   INFLUENZA VACCINE  Completed   HIV Screening  Completed   HPV VACCINES  Aged Out    The following portions of the patient's history were reviewed and updated as appropriate: allergies, current medications, past family history, past medical history, past social history, past surgical history, and problem list.  Review of Systems A comprehensive review of systems was negative.   Objective:    BP 122/80   Pulse 68   Ht 5\' 4"  (1.626 m)   Wt 191 lb  (86.6 kg)   LMP 02/05/2015 (Exact Date)   SpO2 100%   BMI 32.79 kg/m  General appearance: alert, cooperative, and appears stated age Head: Normocephalic, without obvious abnormality, atraumatic Eyes:  conj clear, EOMi, PEERLA Ears: normal TM's and external ear canals both ears Nose: Nares normal. Septum midline. Mucosa normal. No drainage or sinus tenderness. Throat: lips, mucosa, and tongue normal; teeth and gums normal Neck: no adenopathy, no carotid bruit, no JVD, supple, symmetrical, trachea midline, and thyroid not enlarged, symmetric, no tenderness/mass/nodules Back: symmetric, no curvature. ROM normal. No CVA tenderness. Lungs: clear to auscultation bilaterally Breasts: normal appearance, no masses or tenderness Heart: regular rate and rhythm, S1, S2 normal, no murmur, click, rub or gallop Abdomen: soft, non-tender; bowel sounds normal; no masses,  no organomegaly Extremities: extremities normal, atraumatic, no cyanosis or edema Pulses: 2+ and symmetric Skin: Skin color, texture, turgor normal. No rashes or lesions, vitiligo on legs and arms.  Lymph nodes: Cervical adenopathy: nl and Supraclavicular adenopathy: nl Neurologic: Grossly normal    Assessment:    Healthy female exam.      Plan:     See After Visit Summary for Counseling Recommendations  Keep up a regular exercise program and make sure you are eating a healthy diet Try to eat 4 servings of dairy a  day, or if you are lactose intolerant take a calcium with vitamin D daily.  Your vaccines are up to date.   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

## 2021-02-20 LAB — LIPID PANEL W/REFLEX DIRECT LDL
Cholesterol: 193 mg/dL (ref <200)
HDL: 44 mg/dL — ABNORMAL LOW (ref >=50)
LDL Cholesterol (Calc): 125 mg/dL (calc) — ABNORMAL HIGH
Non-HDL Cholesterol (Calc): 149 mg/dL (calc) — ABNORMAL HIGH (ref <130)
Total CHOL/HDL Ratio: 4.4 (calc) (ref <5.0)
Triglycerides: 126 mg/dL (ref <150)

## 2021-02-20 LAB — CBC
HCT: 44.9 % (ref 35.0–45.0)
Hemoglobin: 14.5 g/dL (ref 11.7–15.5)
MCH: 26.4 pg — ABNORMAL LOW (ref 27.0–33.0)
MCHC: 32.3 g/dL (ref 32.0–36.0)
MCV: 81.8 fL (ref 80.0–100.0)
MPV: 12.5 fL (ref 7.5–12.5)
Platelets: 225 10*3/uL (ref 140–400)
RBC: 5.49 10*6/uL — ABNORMAL HIGH (ref 3.80–5.10)
RDW: 13.4 % (ref 11.0–15.0)
WBC: 7.4 10*3/uL (ref 3.8–10.8)

## 2021-02-20 LAB — HEMOGLOBIN A1C
Hgb A1c MFr Bld: 5.6 % of total Hgb (ref <5.7)
Mean Plasma Glucose: 114 mg/dL
eAG (mmol/L): 6.3 mmol/L

## 2021-02-20 LAB — COMPLETE METABOLIC PANEL WITH GFR
AG Ratio: 1 (calc) (ref 1.0–2.5)
ALT: 12 U/L (ref 6–29)
AST: 15 U/L (ref 10–35)
Albumin: 3.8 g/dL (ref 3.6–5.1)
Alkaline phosphatase (APISO): 87 U/L (ref 31–125)
BUN: 15 mg/dL (ref 7–25)
CO2: 28 mmol/L (ref 20–32)
Calcium: 9.2 mg/dL (ref 8.6–10.2)
Chloride: 102 mmol/L (ref 98–110)
Creat: 0.82 mg/dL (ref 0.50–0.99)
Globulin: 3.8 g/dL (calc) — ABNORMAL HIGH (ref 1.9–3.7)
Glucose, Bld: 75 mg/dL (ref 65–99)
Potassium: 3.1 mmol/L — ABNORMAL LOW (ref 3.5–5.3)
Sodium: 139 mmol/L (ref 135–146)
Total Bilirubin: 0.3 mg/dL (ref 0.2–1.2)
Total Protein: 7.6 g/dL (ref 6.1–8.1)
eGFR: 88 mL/min/{1.73_m2} (ref >=60)

## 2021-02-22 ENCOUNTER — Ambulatory Visit: Payer: Managed Care, Other (non HMO) | Admitting: Family Medicine

## 2021-02-22 NOTE — Progress Notes (Signed)
Hi Analyce,  Potassium is still low at 3.1. LDL is up just a little bit from last years to just continue work on healthy diet and regular exercise.  Hemoglobin looks good.  A1c looks good in fact he brought it down to 5.6 which is great.  Would really like to try to take out the hydrochlorothiazide component of your blood pressure pill and see if that corrects the potassium issue or if you want to continue with the potassium tabs we may need to make an adjustment to make sure that what you are taking is adequate.  Please let me know if you have a preference 1 way or the other.

## 2021-06-18 ENCOUNTER — Other Ambulatory Visit: Payer: Self-pay

## 2021-06-18 MED ORDER — LOSARTAN POTASSIUM-HCTZ 100-25 MG PO TABS: 1.0000 | ORAL_TABLET | Freq: Every day | ORAL | 1 refills | Status: DC

## 2021-06-24 ENCOUNTER — Encounter: Payer: Self-pay | Admitting: Family Medicine

## 2021-06-24 ENCOUNTER — Telehealth (INDEPENDENT_AMBULATORY_CARE_PROVIDER_SITE_OTHER): Payer: 59 | Admitting: Family Medicine

## 2021-06-24 VITALS — Resp 16 | Ht 64.0 in | Wt 189.0 lb

## 2021-06-24 DIAGNOSIS — J029 Acute pharyngitis, unspecified: Secondary | ICD-10-CM | POA: Diagnosis not present

## 2021-06-24 NOTE — Progress Notes (Signed)
° ° °  Virtual Visit via Video Note  I connected with Margretta Ditty on 06/24/21 at  8:50 AM EST by a video enabled telemedicine application and verified that I am speaking with the correct person using two identifiers.   I discussed the limitations of evaluation and management by telemedicine and the availability of in person appointments. The patient expressed understanding and agreed to proceed.  Patient location: at home Provider location: in office  Subjective:    CC:   Chief Complaint  Patient presents with   Sore Throat    Cough and runny nose, 1 day.     HPI: She reports that when she got home from work yesterday.  She says it started with a sore throat.  She is not having any difficulty swallowing no fevers or chills or sweats.  She had a little bit of a mild runny nose.  No ear pain.  No chest pain or breathing issues.  She is running a COVID test currently so far is negative but it still has a few minutes (she has been mostly using Mucinex and TheraFlu.  She has been able to work remotely today.  She was concerned because last year around this time she got sick with similar symptoms and had tonsillitis.   Past medical history, Surgical history, Family history not pertinant except as noted below, Social history, Allergies, and medications have been entered into the medical record, reviewed, and corrections made.    Objective:    General: Speaking clearly in complete sentences without any shortness of breath.  Alert and oriented x3.  Normal judgment. No apparent acute distress.    Impression and Recommendations:    Problem List Items Addressed This Visit   None Visit Diagnoses     Pharyngitis, unspecified etiology    -  Primary       No orders of the defined types were placed in this encounter.  We discussed that at this point it is difficult to know what this is going to progress into since her symptoms to started last night with a sore throat.  I gave her warning  signs and symptoms for strep throat and to call and let us know so that we can get her tested if she notices any of those symptoms.  If at any point she feels that the sore throat is getting worse or she is feeling like it swollen or hard to swallow then please let us know immediately.  Monitor for any other cold symptoms such as nasal congestion and cough.  Okay to use ibuprofen and Aleve and recommend salt water gargles.   No orders of the defined types were placed in this encounter.    I discussed the assessment and treatment plan with the patient. The patient was provided an opportunity to ask questions and all were answered. The patient agreed with the plan and demonstrated an understanding of the instructions.   The patient was advised to call back or seek an in-person evaluation if the symptoms worsen or if the condition fails to improve as anticipated.   Beatrice Lecher, MD

## 2021-08-19 ENCOUNTER — Ambulatory Visit: Payer: Managed Care, Other (non HMO) | Admitting: Family Medicine

## 2021-09-07 ENCOUNTER — Encounter: Payer: Self-pay | Admitting: Family Medicine

## 2021-09-07 ENCOUNTER — Ambulatory Visit: Payer: 59 | Admitting: Family Medicine

## 2021-09-07 VITALS — BP 130/88 | HR 73 | Resp 18 | Ht 64.0 in | Wt 192.0 lb

## 2021-09-07 DIAGNOSIS — R682 Dry mouth, unspecified: Secondary | ICD-10-CM | POA: Diagnosis not present

## 2021-09-07 DIAGNOSIS — M329 Systemic lupus erythematosus, unspecified: Secondary | ICD-10-CM

## 2021-09-07 DIAGNOSIS — M7711 Lateral epicondylitis, right elbow: Secondary | ICD-10-CM

## 2021-09-07 DIAGNOSIS — Z1231 Encounter for screening mammogram for malignant neoplasm of breast: Secondary | ICD-10-CM | POA: Diagnosis not present

## 2021-09-07 NOTE — Patient Instructions (Signed)
Recommend a trial of Voltaren gel applied to the outer elbow twice a day.  This can be found at the local pharmacy. ?Also look for a elbow strap or tennis elbow strap. ?

## 2021-09-07 NOTE — Progress Notes (Signed)
? ?Acute Office Visit ? ?Subjective:  ? ? Patient ID: Anna Patel, female    DOB: [birth year], 51 y.o.   MRN: [id number] ? ?Chief Complaint  ?Patient presents with  ? Elbow Pain  ?  Right elbow pain for 2 weeks. difficulty picking up objects. No known injury.   ? dry mouth  ?  2 months. Saliva tastes salty.    ? ? ?HPI ?Patient is in today for left elbow pain. Right elbow pain for 2 weeks. difficulty picking up objects. No known injury.  She says her symptoms started about 1 month ago.  She denies any known injury or trauma.  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  She has been using Tylenol and an over-the-counter rub which does help somewhat. ? ?Dry mouth - 2 months.  She notices it sometimes at night when she is breathing open mouth.  But now its become more persistent.  Saliva tastes salty.  Currently taking 25 mg of hydrochlorothiazide, but she has been on this for years with no recent changes.Marland Kitchen Hx of lupus and JRA.  He feels like the dry mouth is gotten worse particularly in the last couple of weeks.  She denies any dry eye symptoms. ? ?Past Medical History:  ?Diagnosis Date  ? Anal intraepithelial neoplasia   ? Blood transfusion without reported diagnosis age 61  ? due to lupus   ? History of kidney stones   ? History of pre-eclampsia 2010  ? Hypertension   ? JRA (juvenile rheumatoid arthritis) ([place])   ? SLE (systemic lupus erythematosus) ([place])   ? Wears glasses   ? for reading  ? ? ?Past Surgical History:  ?Procedure Laterality Date  ? ANAL INTRAEPITHELIAL NEOPLASIA EXCISION N/A 09/24/2020  ? Procedure: EXCISION ANAL INTRAEPITHELIAL NEOPLASIA;  Surgeon: [name], MD;  Location: [facility];  Service: General;  Laterality: N/A;  ? RECTAL EXAM UNDER ANESTHESIA N/A 09/24/2020  ? Procedure: RECTAL EXAM UNDER ANESTHESIA;  Surgeon: [name], MD;  Location: [facility];  Service: General;  Laterality:  N/A;  ? TUBAL LIGATION  2010  ? ? ?Family History  ?Problem Relation Age of Onset  ? Heart disease Mother   ?     In her 80's  ? Hypertension Mother   ? Stroke Mother   ?     CVA x 2, in her 2s  ? Breast cancer Mother   ?     DCIS  ? Goiter Mother   ? Lupus Paternal Aunt   ?     Deceased  ? Colon cancer Neg Hx   ? Esophageal cancer Neg Hx   ? Stomach cancer Neg Hx   ? Rectal cancer Neg Hx   ? ? ?Social History  ? ?Socioeconomic History  ? Marital status: Married  ?  Spouse name: Not on file  ? Number of children: 3  ? Years of education: Not on file  ? Highest education level: Not on file  ?Occupational History  ? Occupation: [phone]  ?  Employer: [employer]   ?Tobacco Use  ? Smoking status: Never  ? Smokeless tobacco: Never  ?Vaping Use  ? Vaping Use: Never used  ?Substance and Sexual Activity  ? Alcohol use: No  ? Drug use: No  ? Sexual activity: Yes  ?  Birth control/protection: None  ?  Comment: married, adopted a sons, 1 abortion, works  a English as a second language teacher, does aroebics 3 X week, trying to lose weight.  ?Other Topics Concern  ? Not on file  ?Social History Narrative  ? Some exercise but not regular.  Works in a Product manager.   ? ?Social Determinants of Health  ? ?Financial Resource Strain: Not on file  ?Food Insecurity: Not on file  ?Transportation Needs: Not on file  ?Physical Activity: Not on file  ?Stress: Not on file  ?Social Connections: Not on file  ?Intimate Partner Violence: Not on file  ? ? ?Outpatient Medications Prior to Visit  ?Medication Sig Dispense Refill  ? amLODipine (NORVASC) 10 MG tablet TAKE 1 TABLET BY MOUTH DAILY 90 tablet 1  ? aspirin EC 81 MG tablet Take 81 mg by mouth daily.    ? Calcium Carbonate-Vitamin D 500-125 MG-UNIT TABS Take by mouth 2 (two) times daily.    ? hydroxychloroquine (PLAQUENIL) 200 MG tablet Take by mouth daily.    ? losartan-hydrochlorothiazide (HYZAAR) 100-25 MG tablet Take 1 tablet by mouth daily. 90 tablet 1  ? Multiple Vitamin (MULTIVITAMIN) tablet Take 1  tablet by mouth daily.    ? potassium chloride SA (KLOR-CON) 20 MEQ tablet Take 1 tablet (20 mEq total) by mouth daily. 90 tablet 1  ? ?No facility-administered medications prior to visit.  ? ? ?Allergies  ?Allergen Reactions  ? Sulfa Antibiotics Nausea And Vomiting  ?  Aggravates lupus symptoms  ? Sulfonamide Derivatives   ?  Sulfa drugs trigger patient's lupsus  ? ? ?Review of Systems ? ?   ?Objective:  ?  ?Physical Exam ?Vitals reviewed.  ?Constitutional:   ?   Appearance: She is well-developed.  ?HENT:  ?   Head: Normocephalic and atraumatic.  ?Eyes:  ?   Conjunctiva/sclera: Conjunctivae normal.  ?Cardiovascular:  ?   Rate and Rhythm: Normal rate.  ?Pulmonary:  ?   Effort: Pulmonary effort is normal.  ?Musculoskeletal:  ?   Comments: Tender over the lateral epicondyles.  She had pain with pronation and supination.  Strength is normal at the elbow and wrist.  ?Skin: ?   General: Skin is dry.  ?   Coloration: Skin is not pale.  ?Neurological:  ?   Mental Status: She is alert and oriented to person, place, and time.  ?Psychiatric:     ?   Behavior: Behavior normal.  ? ? ?BP 130/88   Pulse 73   Resp 18   Ht _0  (1.626 m)   Wt 192 lb (87.1 kg)   LMP 02/05/2015 (Exact Date)   SpO2 97%   BMI 32.96 kg/m?  ?Wt Readings from Last 3 Encounters:  ?09/07/21 192 lb (87.1 kg)  ?06/24/21 189 lb (85.7 kg)  ?02/19/21 191 lb (86.6 kg)  ? ? ?Health Maintenance Due  ?Topic Date Due  ? Hepatitis C Screening  Never done  ? MAMMOGRAM  02/26/2021  ? ? ?There are no preventive care reminders to display for this patient. ? ? ?Lab Results  ?Component Value Date  ? TSH 0.75 08/18/2020  ? ?Lab Results  ?Component Value Date  ? WBC 7.4 02/19/2021  ? HGB 14.5 02/19/2021  ? HCT 44.9 02/19/2021  ? MCV 81.8 02/19/2021  ? PLT 225 02/19/2021  ? ?Lab Results  ?Component Value Date  ? NA 139 02/19/2021  ? K 3.1 (L) 02/19/2021  ? CO2 28 02/19/2021  ? GLUCOSE 75 02/19/2021  ? BUN 15 02/19/2021  ? CREATININE 0.82 02/19/2021  ? BILITOT 0.3  02/19/2021  ? ALKPHOS  73 03/31/2016  ? AST 15 02/19/2021  ? ALT 12 02/19/2021  ? PROT 7.6 02/19/2021  ? ALBUMIN 3.6 03/31/2016  ? CALCIUM 9.2 02/19/2021  ? EGFR 88 02/19/2021  ? ?Lab Results  ?Component Value Date  ? CHOL 193 02/19/2021  ? ?Lab Results  ?Component Value Date  ? HDL 44 (L) 02/19/2021  ? ?Lab Results  ?Component Value Date  ? LDLCALC 125 (H) 02/19/2021  ? ?Lab Results  ?Component Value Date  ? TRIG 126 02/19/2021  ? ?Lab Results  ?Component Value Date  ? CHOLHDL 4.4 02/19/2021  ? ?Lab Results  ?Component Value Date  ? HGBA1C 5.6 02/19/2021  ? ? ?   ?Assessment & Plan:  ? ?Problem List Items Addressed This Visit   ? ?  ? Other  ? Systemic lupus erythematosus (SLE) in adult [facility])  ? Relevant Orders  ? COMPLETE METABOLIC PANEL WITH GFR  ? TSH  ? Early Sjogren's Syndrome Profile  ? CBC  ? Vitamin A  ? Hemoglobin A1c  ? ?Other Visit Diagnoses   ? ? Encounter for screening mammogram for malignant neoplasm of breast    -  Primary  ? Relevant Orders  ? MM 3D SCREEN BREAST BILATERAL  ? Lateral epicondylitis of right elbow      ? Dry mouth      ? Relevant Orders  ? COMPLETE METABOLIC PANEL WITH GFR  ? TSH  ? Early Sjogren's Syndrome Profile  ? CBC  ? Vitamin A  ? Hemoglobin A1c  ? ?  ? ?Lateral epicondylitis-discussed diagnosis recommend trial of home rehab.  Applying Voltaren gel, and elbow strap.  If not improving over the next 3 weeks then please return for possible injection. ? ?Dry mouth-unclear etiology we discussed making sure that she continues to hydrate well, running a humidifier at night.  They do make some products over-the-counter such as Biotene mouthwash and lozenges that can be helpful as well.  In the meantime we will evaluate for signs of dehydration, thyroid abnormality, diabetes, vitamin D deficiency, and potential Sjogren's which can certainly overlap with her lupus and RA. ? ?No orders of the defined types were placed in this encounter. ? ? ? ?Beatrice Lecher, MD ? ?

## 2021-09-09 NOTE — Progress Notes (Signed)
Anna Patel, hemoglobin looks great at 15.  Your potassium is a little bit low at 3.2 but better than the last couple of times that we have checked it.  Continue with current potassium supplementLiver function is normal.  A1c is in the prediabetic range to just continue to work on healthy food choices and regular exercise.  Your thyroid looks great.  Couple of test still pending.

## 2021-09-09 NOTE — Progress Notes (Signed)
Isaac Laud, I apologize for misspelling your name on the previous note.  I was trying to use my dictation software and did not catch it before I hit send.  I apologize .

## 2021-09-13 NOTE — Progress Notes (Signed)
Hi Anna Patel,  ?Your protein level was just a little borderline elevated nothing in the worrisome range but I would like to keep an eye on it and plan to recheck that again in about a month with a CMP.  Your vitamin A is normal.  The Sjogren's profile is still pending.   ? ?We may need to call the lab and see when it should be back.  It is already been 6 days.

## 2021-09-14 ENCOUNTER — Other Ambulatory Visit: Payer: Self-pay

## 2021-09-14 DIAGNOSIS — E876 Hypokalemia: Secondary | ICD-10-CM

## 2021-09-14 MED ORDER — POTASSIUM CHLORIDE CRYS ER 20 MEQ PO TBCR: 20.0000 meq | EXTENDED_RELEASE_TABLET | Freq: Every day | ORAL | 0 refills | Status: DC

## 2021-09-15 ENCOUNTER — Encounter: Payer: Self-pay | Admitting: Family Medicine

## 2021-09-15 LAB — COMPLETE METABOLIC PANEL WITH GFR
AG Ratio: 1 (calc) (ref 1.0–2.5)
ALT: 17 U/L (ref 6–29)
AST: 20 U/L (ref 10–35)
Albumin: 4.1 g/dL (ref 3.6–5.1)
Alkaline phosphatase (APISO): 98 U/L (ref 31–125)
BUN: 10 mg/dL (ref 7–25)
CO2: 24 mmol/L (ref 20–32)
Calcium: 9.7 mg/dL (ref 8.6–10.2)
Chloride: 104 mmol/L (ref 98–110)
Creat: 0.78 mg/dL (ref 0.50–0.99)
Globulin: 4.1 g/dL (calc) — ABNORMAL HIGH (ref 1.9–3.7)
Glucose, Bld: 79 mg/dL (ref 65–99)
Potassium: 3.2 mmol/L — ABNORMAL LOW (ref 3.5–5.3)
Sodium: 145 mmol/L (ref 135–146)
Total Bilirubin: 0.4 mg/dL (ref 0.2–1.2)
Total Protein: 8.2 g/dL — ABNORMAL HIGH (ref 6.1–8.1)
eGFR: 94 mL/min/{1.73_m2} (ref >=60)

## 2021-09-15 LAB — VITAMIN A: Vitamin A (Retinoic Acid): 51 ug/dL (ref 38–98)

## 2021-09-15 LAB — CBC
HCT: 46 % — ABNORMAL HIGH (ref 35.0–45.0)
Hemoglobin: 15 g/dL (ref 11.7–15.5)
MCH: 26.5 pg — ABNORMAL LOW (ref 27.0–33.0)
MCHC: 32.6 g/dL (ref 32.0–36.0)
MCV: 81.1 fL (ref 80.0–100.0)
MPV: 12.6 fL — ABNORMAL HIGH (ref 7.5–12.5)
Platelets: 220 10*3/uL (ref 140–400)
RBC: 5.67 10*6/uL — ABNORMAL HIGH (ref 3.80–5.10)
RDW: 13.5 % (ref 11.0–15.0)
WBC: 7.2 10*3/uL (ref 3.8–10.8)

## 2021-09-15 LAB — TSH: TSH: 1.08 mIU/L

## 2021-09-15 LAB — HEMOGLOBIN A1C
Hgb A1c MFr Bld: 5.7 % of total Hgb — ABNORMAL HIGH (ref <5.7)
Mean Plasma Glucose: 117 mg/dL
eAG (mmol/L): 6.5 mmol/L

## 2021-09-15 LAB — EARLY SJOGREN'S SYNDROME PROFILE
CARBONIC ANHYDRASE VI (CA VI) IGA ANTIBODIES: 13.3 EU/ml
CARBONIC ANHYDRASE VI (CA VI) IGG ANTIBODIES: 13.3 EU/ml
CARBONIC ANHYDRASE VI (CA VI) IGM ANTIBODIES: 12 EU/ml
PAROTID SPECIFIC PROTEIN (PSP) IGA ANTIBODIES: 16.3 EU/ml
PAROTID SPECIFIC PROTEIN (PSP) IGG ANTIBODIES: 13.1 EU/ml
PAROTID SPECIFIC PROTEIN (PSP) IGM ANTIBODIES: 7 EU/ml
SALIVARY PROTEIN 1 (SP 1) IGA ANTIBODIES: 1 EU/ml
SALIVARY PROTEIN 1 (SP 1) IGG ANTIBODIES: 9.6 EU/ml
SALIVARY PROTEIN 1 (SP 1) IGM ANTIBODIES: 3.4 EU/ml

## 2021-09-15 NOTE — Progress Notes (Signed)
Hi Anna Patel, the Sjogren's panel came back negative which is great.  But it still does not explain the dry mouth.  Are you still having a lot of symptoms or has not gotten a little better?

## 2021-12-22 ENCOUNTER — Other Ambulatory Visit: Payer: Self-pay | Admitting: *Deleted

## 2021-12-22 DIAGNOSIS — I10 Essential (primary) hypertension: Secondary | ICD-10-CM

## 2021-12-22 MED ORDER — AMLODIPINE BESYLATE 10 MG PO TABS: 10.0000 mg | ORAL_TABLET | Freq: Every day | ORAL | 1 refills | Status: DC

## 2022-02-21 ENCOUNTER — Ambulatory Visit (INDEPENDENT_AMBULATORY_CARE_PROVIDER_SITE_OTHER): Payer: 59 | Admitting: Family Medicine

## 2022-02-21 ENCOUNTER — Encounter: Payer: Self-pay | Admitting: Family Medicine

## 2022-02-21 ENCOUNTER — Other Ambulatory Visit (HOSPITAL_COMMUNITY)
Admission: RE | Admit: 2022-02-21 | Discharge: 2022-02-21 | Disposition: A | Discharge status: Home / Self Care | Payer: 59 | Source: Ambulatory Visit | Attending: Family Medicine | Admitting: Family Medicine

## 2022-02-21 VITALS — BP 114/74 | HR 80 | Ht 64.0 in | Wt 194.0 lb

## 2022-02-21 DIAGNOSIS — Z124 Encounter for screening for malignant neoplasm of cervix: Secondary | ICD-10-CM | POA: Insufficient documentation

## 2022-02-21 DIAGNOSIS — N949 Unspecified condition associated with female genital organs and menstrual cycle: Secondary | ICD-10-CM

## 2022-02-21 DIAGNOSIS — Z0000 Encounter for general adult medical examination without abnormal findings: Secondary | ICD-10-CM | POA: Diagnosis not present

## 2022-02-21 DIAGNOSIS — Z23 Encounter for immunization: Secondary | ICD-10-CM | POA: Diagnosis not present

## 2022-02-21 DIAGNOSIS — N912 Amenorrhea, unspecified: Secondary | ICD-10-CM

## 2022-02-21 NOTE — Progress Notes (Signed)
Complete physical exam  Patient: Anna Patel   DOB: [birth year]   51 y.o. Female  MRN: [id number]  Subjective:    Chief Complaint  Patient presents with   Annual Exam    Anna Patel is a 51 y.o. female who presents today for a complete physical exam. She reports consuming a general diet.  Walking 4 days per week  She generally feels well. She reports sleeping well. She does not have additional problems to discuss today.   She also reports a burning sensation in the distal vaginal area just past the opening.  It bothers her occasionally with intercourse is not necessarily every day but at other times she feels it as well.  Sometimes leaving noticed a little burning with urination but she recently had a urinalysis done with her rheumatologist.  No abnormal discharge.  Her last menstrual cycle was right around age 75 so about 4 years ago.   Most recent fall risk assessment:    02/21/2022    2:33 PM  [place] in the past year? 0  Number falls in past yr: 0  Injury with Fall? 0  Risk for fall due to : No Fall Risks  Follow up Falls evaluation completed     Most recent depression screenings:    02/21/2022    2:33 PM 09/07/2021    9:21 AM  PHQ 2/9 Scores  PHQ - 2 Score 0 0        Patient Care Team: Hali Marry, MD as PCP - General (Family Medicine) Darci Needle, MD (Rheumatology)   Outpatient Medications Prior to Visit  Medication Sig   amLODipine (NORVASC) 10 MG tablet Take 1 tablet (10 mg total) by mouth daily.   aspirin EC 81 MG tablet Take 81 mg by mouth daily.   Calcium Carbonate-Vitamin D 500-125 MG-UNIT TABS Take by mouth 2 (two) times daily.   hydroxychloroquine (PLAQUENIL) 200 MG tablet Take by mouth daily.   losartan-hydrochlorothiazide (HYZAAR) 100-25 MG tablet Take 1 tablet by mouth daily.   Multiple Vitamin (MULTIVITAMIN) tablet Take 1 tablet by mouth daily.   potassium chloride SA (KLOR-CON M) 20 MEQ tablet Take 1 tablet (20 mEq  total) by mouth daily.   No facility-administered medications prior to visit.    ROS        Objective:     BP 114/74   Pulse 80   Ht '5\' 4"'$  (1.626 m)   Wt 194 lb (88 kg)   LMP 02/05/2015 (Exact Date)   SpO2 97%   BMI 33.30 kg/m    Physical Exam Vitals and nursing note reviewed. Exam conducted with a chaperone present.  Constitutional:      Appearance: She is well-developed.  HENT:     Patel: Normocephalic and atraumatic.     Right Ear: Tympanic membrane, ear canal and external ear normal.     Left Ear: Tympanic membrane, ear canal and external ear normal.     Nose: Nose normal.     Mouth/Throat:     Pharynx: Oropharynx is clear.  Eyes:     Conjunctiva/sclera: Conjunctivae normal.     Pupils: Pupils are equal, round, and reactive to light.  Neck:     Thyroid: No thyromegaly.  Cardiovascular:     Rate and Rhythm: Normal rate and regular rhythm.     Heart sounds: Normal heart sounds.  Pulmonary:     Effort: Pulmonary effort is normal.     Breath sounds:  Normal breath sounds. No wheezing.  Chest:     Chest wall: No mass.  Breasts:    Right: Normal. No mass.     Left: Normal. No mass.  Abdominal:     General: Bowel sounds are normal.  Genitourinary:    General: Normal vulva.     Labia:        Right: No rash.        Left: No rash.      Vagina: Normal.     Cervix: Normal.     Uterus: Normal.      Adnexa: Right adnexa normal and left adnexa normal.  Musculoskeletal:     Cervical back: Neck supple.  Lymphadenopathy:     Cervical: No cervical adenopathy.     Upper Body:     Right upper body: No axillary adenopathy.     Left upper body: No axillary adenopathy.  Skin:    General: Skin is warm and dry.  Neurological:     Mental Status: She is alert and oriented to person, place, and time.  Psychiatric:        Behavior: Behavior normal.      No results found for any visits on 02/21/22.     Assessment & Plan:    Routine Health Maintenance and Physical  Exam  Immunization History  Administered Date(s) Administered   Influenza,inj,Quad PF,6+ Mos 01/25/2013, 02/17/2014, 01/20/2015, 03/17/2016, 02/14/2017, 02/14/2018, 02/18/2019, 02/19/2020, 02/19/2021, 02/21/2022   Influenza-Unspecified 01/25/2013, 02/17/2014, 01/20/2015, 03/17/2016   Janssen (J&J) SARS-COV-2 Vaccination 08/26/2019   PFIZER(Purple Top)SARS-COV-2 Vaccination 02/28/2020   Td 07/20/2010   Tdap 08/18/2020    Health Maintenance  Topic Date Due   Hepatitis C Screening  Never done   COVID-19 Vaccine (3 - Janssen risk series) 04/24/2020   MAMMOGRAM  02/26/2021   PAP SMEAR-Modifier  02/18/2025   COLONOSCOPY (Pts 45-35yr Insurance coverage will need to be confirmed)  06/29/2030   TETANUS/TDAP  08/19/2030   INFLUENZA VACCINE  Completed   HIV Screening  Completed   Pneumococcal Vaccine [id number] old  Aged Out   HPV VACCINES  Aged Out    Discussed health benefits of physical activity, and encouraged her to engage in regular exercise appropriate for her age and condition.  Problem List Items Addressed This Visit   None Visit Diagnoses     Wellness examination    -  Primary   Relevant Orders   Lipid Panel w/reflex Direct LDL   Hemoglobin A1c   Estradiol   Progesterone   Follicle stimulating hormone   Luteinizing hormone   Screening for cervical cancer       Relevant Orders   Cytology - PAP   Need for immunization against influenza       Relevant Orders   Flu Vaccine QUAD 656moM (Fluarix, Fluzone & Alfiuria Quad PF) (Completed)   Burning sensation of female pelvis       Amenorrhea       Relevant Orders   Estradiol   Progesterone   Follicle stimulating hormone   Luteinizing hormone       Keep up a regular exercise program and make sure you are eating a healthy diet Try to eat 4 servings of dairy a day, or if you are lactose intolerant take a calcium with vitamin D daily.  Your vaccines are up to date.     No follow-ups on file.     CaBeatrice LecherMD

## 2022-02-21 NOTE — Progress Notes (Signed)
Pt will have her mammogram done at work on 03/03/2022.

## 2022-02-22 ENCOUNTER — Other Ambulatory Visit: Payer: 59

## 2022-02-23 LAB — HEMOGLOBIN A1C
Hgb A1c MFr Bld: 5.8 % of total Hgb — ABNORMAL HIGH (ref <5.7)
Mean Plasma Glucose: 120 mg/dL
eAG (mmol/L): 6.6 mmol/L

## 2022-02-23 LAB — LIPID PANEL W/REFLEX DIRECT LDL
Cholesterol: 193 mg/dL (ref <200)
HDL: 47 mg/dL — ABNORMAL LOW (ref >=50)
LDL Cholesterol (Calc): 119 mg/dL (calc) — ABNORMAL HIGH
Non-HDL Cholesterol (Calc): 146 mg/dL (calc) — ABNORMAL HIGH (ref <130)
Total CHOL/HDL Ratio: 4.1 (calc) (ref <5.0)
Triglycerides: 154 mg/dL — ABNORMAL HIGH (ref <150)

## 2022-02-23 LAB — FOLLICLE STIMULATING HORMONE: FSH: 74.7 m[IU]/mL

## 2022-02-23 LAB — PROGESTERONE: Progesterone: <0.5 ng/mL

## 2022-02-23 LAB — ESTRADIOL: Estradiol: 28 pg/mL

## 2022-02-23 LAB — LUTEINIZING HORMONE: LH: 31.8 m[IU]/mL

## 2022-02-23 NOTE — Progress Notes (Signed)
Hi Anna Patel, LDL cholesterol is mildly elevated as well as triglycerides.  Though your LDL does look a little bit better this year compared to last year.  Just continue to work on healthy diet and regular exercise.  Your A1c is 5.8 so pretty stable over the last 4 to 5 years.  Your hormones do show that you look like you are postmenopausal so you likely will not have another.Marland Kitchen

## 2022-02-28 LAB — CYTOLOGY - PAP
Comment: NEGATIVE
Diagnosis: NEGATIVE — AB
Diagnosis: REACTIVE — AB
Diagnosis: UNDETERMINED — AB
High risk HPV: NEGATIVE

## 2022-02-28 NOTE — Progress Notes (Signed)
Hi Shonya, on your Pap smear they saw a few atypical cells.  They did not look like cancer but they also did not look completely normal.  Since her HPV testing was negative the recommendation is to repeat your Pap smear in 1 year.

## 2022-03-03 LAB — HM MAMMOGRAPHY

## 2022-04-04 ENCOUNTER — Encounter: Payer: Self-pay | Admitting: Family Medicine

## 2022-04-04 NOTE — Progress Notes (Signed)
03/03/2022 

## 2022-04-15 ENCOUNTER — Other Ambulatory Visit: Payer: Self-pay | Admitting: Neurology

## 2022-04-15 DIAGNOSIS — E876 Hypokalemia: Secondary | ICD-10-CM

## 2022-04-15 MED ORDER — LOSARTAN POTASSIUM-HCTZ 100-25 MG PO TABS: 1.0000 | ORAL_TABLET | Freq: Every day | ORAL | 1 refills | Status: DC

## 2022-04-15 MED ORDER — POTASSIUM CHLORIDE CRYS ER 20 MEQ PO TBCR: 20.0000 meq | EXTENDED_RELEASE_TABLET | Freq: Every day | ORAL | 1 refills | Status: DC

## 2022-08-26 ENCOUNTER — Other Ambulatory Visit: Payer: Self-pay | Admitting: Family Medicine

## 2022-08-26 DIAGNOSIS — I10 Essential (primary) hypertension: Secondary | ICD-10-CM

## 2022-09-29 ENCOUNTER — Encounter: Payer: Self-pay | Admitting: Family Medicine

## 2022-09-29 ENCOUNTER — Ambulatory Visit: Payer: 59 | Admitting: Family Medicine

## 2022-09-29 ENCOUNTER — Ambulatory Visit (INDEPENDENT_AMBULATORY_CARE_PROVIDER_SITE_OTHER): Payer: 59

## 2022-09-29 VITALS — BP 129/75 | HR 84 | Ht 64.0 in | Wt 195.0 lb

## 2022-09-29 DIAGNOSIS — M545 Low back pain, unspecified: Secondary | ICD-10-CM

## 2022-09-29 DIAGNOSIS — M79671 Pain in right foot: Secondary | ICD-10-CM

## 2022-09-29 MED ORDER — MELOXICAM 15 MG PO TABS: 15.0000 mg | ORAL_TABLET | Freq: Every day | ORAL | 0 refills | Status: DC

## 2022-09-29 NOTE — Progress Notes (Signed)
Acute Office Visit  Subjective:     Patient ID: Anna Patel, female    DOB: 12/25/1970, 52 y.o.   MRN: [id number]  Chief Complaint  Patient presents with   Back Pain   Foot Pain    HPI Patient is in today for low back and right foot pain.   Pt reports that she has been experiencing low back pain for the past 3-4 months denies any injury/trauma. She stated that the pain comes and goes, achy,and squeezes and rates it 6/10 mostly R sided. She stretches and gets into the shower for relief. She also has a lumbar support that she uses.  No known injury.    R foot pain/swelling across the top of the foot. She spoke to her rheumatologist about this and was advised to use compression socks but wasn't told what grade.  No known injury.    ROS      Objective:    BP 129/75   Pulse 84   Ht 5\' 4"  (1.626 m)   Wt 195 lb (88.5 kg)   LMP 02/05/2015 (Exact Date)   SpO2 94%   BMI 33.47 kg/m    Physical Exam Vitals and nursing note reviewed.  Constitutional:      Appearance: She is well-developed.  HENT:     Head: Normocephalic and atraumatic.  Musculoskeletal:     Comments: Normal lumbar flexion, extension, rotation and side bending. Pain in both direction. LE stregnth is 5/5.   Tender over right foot at prox ends of the MTs across her foot.   Skin:    General: Skin is warm and dry.  Neurological:     Mental Status: She is alert and oriented to person, place, and time.  Psychiatric:        Behavior: Behavior normal.     No results found for any visits on 09/29/22.      Assessment & Plan:   Problem List Items Addressed This Visit   None Visit Diagnoses     Right foot pain    -  Primary   Relevant Orders   DG Foot Complete Right   Acute right-sided low back pain without sciatica       Relevant Medications   meloxicam (MOBIC) 15 MG tablet   Other Relevant Orders   DG Lumbar Spine Complete       Foot pain pain is mostly over the midfoot.  Will start with  getting a plain film x-ray since this has been going on for months though she does remember specific injury or trauma.  For lumbar spine will also get plain from x-rays since this has been going on for 4 months.  Given a handout with stretches to do on her own at home.  Right now she has radiation across her back but no radiculopathy down into the legs.  She would most likely benefit from formal physical therapy.  Will call with results once available.  Recommend compression stockings that are around 20-25 pressure.  They can be typically found on [place] or our local DME supplier here in [place].  The meantime recommend a trial of meloxicam for pain relief.  Then take for 1 to 2 weeks to see if this helps and then as needed after that.  Meds ordered this encounter  Medications   meloxicam (MOBIC) 15 MG tablet    Sig: Take 1 tablet (15 mg total) by mouth daily.    Dispense:  30 tablet    Refill:  0    No follow-ups on file.  Nani Gasser, MD

## 2022-09-29 NOTE — Progress Notes (Signed)
Pt reports that she has been experiencing low back pain for the past 3-4 months denies any injury/trauma. She stated that the pain comes and goes, achy,and squeezes and rates it 6/10 mostly R sided. She stretches and gets into the shower for relief. She also has a lumbar support that she uses.   R foot pain/swelling across the top of the foot. She spoke to her rheumatologist about this and was advised to use compression socks but wasn't told what grade.

## 2022-10-04 NOTE — Progress Notes (Signed)
Hi Anna Patel,  X-ray of your low back shows no sign of fracture.  But there is some calcification over the ligaments this can come from arthritis.  The disc spaces look pretty good which is reassuring as far as no sign of a degenerated disc.  I still think you would benefit greatly from formal physical therapy.  Let us know if you are okay with [place] placing the referral.

## 2022-10-04 NOTE — Progress Notes (Signed)
Hi Anna Patel,  X-ray of your foot overall looks good as far as no old fracture or dislocation there is a little bit of a bone spur at the midfoot and a little heel spur as well.  I would like to get you in for further evaluation with podiatry.  If you are okay with that then please let us know and we will get you scheduled.  We have a podiatrist that comes to our building on Fridays.

## 2022-10-05 ENCOUNTER — Other Ambulatory Visit: Payer: Self-pay

## 2022-10-05 DIAGNOSIS — M79671 Pain in right foot: Secondary | ICD-10-CM

## 2022-10-07 ENCOUNTER — Encounter: Payer: Self-pay | Admitting: Family Medicine

## 2022-10-13 ENCOUNTER — Ambulatory Visit (INDEPENDENT_AMBULATORY_CARE_PROVIDER_SITE_OTHER): Payer: 59

## 2022-10-13 ENCOUNTER — Encounter: Payer: Self-pay | Admitting: Podiatry

## 2022-10-13 ENCOUNTER — Ambulatory Visit: Payer: 59 | Admitting: Podiatry

## 2022-10-13 DIAGNOSIS — M19071 Primary osteoarthritis, right ankle and foot: Secondary | ICD-10-CM

## 2022-10-13 DIAGNOSIS — M7989 Other specified soft tissue disorders: Secondary | ICD-10-CM

## 2022-10-13 DIAGNOSIS — M79671 Pain in right foot: Secondary | ICD-10-CM | POA: Diagnosis not present

## 2022-10-13 NOTE — Progress Notes (Signed)
  Subjective:  Patient ID: Anna Patel, female    DOB: Nov 22, 1970,   MRN: [id number]  Chief Complaint  Patient presents with   Foot Pain    Patient came in today for right top of the foot pain started a year ago, rate of pain 8 out of 10, pain after siting, seen PCP and was given mobic, X-Rays done today     52 y.o. female presents for concern for right foot pain that has been present for about a year. Denies any injury. She does have a history of lupus. Relates swelling and pain on the top of the right foot. She has been taking meloxicam for back and somewhat helping her foot.  D . Denies any other pedal complaints. Denies n/v/f/c.   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 CFT <3 seconds. Normal hair growth on digits. No edema.  Skin. No lacerations or abrasions bilateral feet.  Musculoskeletal: MMT 5/5 bilateral lower extremities in DF, PF, Inversion and Eversion. Deceased ROM in DF of ankle joint. Tender over second and third TMTJ. Some pain with ROM of the second ray. No pain over ankle or rest of foot area. No pain along first metatarsal or medial joints.  Neurological: Sensation intact to light touch.   Assessment:   1. Arthritis of right midfoot      Plan:  Patient was evaluated and treated and all questions answered. Discussed midfoot arthritis with patient and treatment options.  Reviewed notes from Dr. Linford Arnold X-rays reviewed. No acute fractures or dislocations. Degenerative changes noted throughout midfoot. Mostly in second and third TMTJ. Mild pes planus noted.  Discussed NSAIDS, topicals, and  possible injections.  Continue meloxicam as needed for foot.  Discussed possible injection prior to her trip coming up in July.  Discussed stiff soled shoes and CMO patient will look into.   Discussed if pain does not improve can discuss surgical options.  Patient to follow-up as needed.    Louann Sjogren, DPM

## 2022-11-21 ENCOUNTER — Ambulatory Visit: Payer: 59 | Admitting: Podiatry

## 2022-11-21 ENCOUNTER — Encounter: Payer: Self-pay | Admitting: Podiatry

## 2022-11-21 DIAGNOSIS — M779 Enthesopathy, unspecified: Secondary | ICD-10-CM

## 2022-11-21 MED ORDER — TRIAMCINOLONE ACETONIDE 10 MG/ML IJ SUSP
10.0000 mg | Freq: Once | INTRAMUSCULAR | Status: AC
Start: 2022-11-21 — End: 2022-11-21
  Administered 2022-11-21: 10 mg

## 2022-11-23 NOTE — Progress Notes (Signed)
Subjective:   Patient ID: Anna Patel, female   DOB: 52 y.o.   MRN: [id number]   HPI Patient presents stating she has a lot of pain on top of the right foot and is getting ready to go on vacation wants medication   ROS      Objective:  Physical Exam  Neurovascular status intact with inflammation pain of the dorsum of the right foot in the extensor complex midtarsal joint with fluid buildup     Assessment:  Inflammatory tendinitis of the right midfoot dorsal     Plan:  H&P reviewed sterile prep injected the tendon complex 3 mg Dexasone Kenalog 5 mg Xylocaine advised prior to doing this chances for rupture associated with injections and will be seen back as symptoms indicate

## 2022-12-12 ENCOUNTER — Encounter: Payer: Self-pay | Admitting: Physician Assistant

## 2022-12-12 ENCOUNTER — Ambulatory Visit: Payer: 59 | Admitting: Physician Assistant

## 2022-12-12 VITALS — BP 128/86 | HR 78 | Temp 97.9°F | Ht 64.0 in | Wt 198.0 lb

## 2022-12-12 DIAGNOSIS — J329 Chronic sinusitis, unspecified: Secondary | ICD-10-CM

## 2022-12-12 MED ORDER — AMOXICILLIN-POT CLAVULANATE 875-125 MG PO TABS
1.0000 | ORAL_TABLET | Freq: Two times a day (BID) | ORAL | 0 refills | Status: AC
Start: 2022-12-12 — End: ?

## 2022-12-12 MED ORDER — FLUTICASONE PROPIONATE 50 MCG/ACT NA SUSP
2.0000 | Freq: Every day | NASAL | 0 refills | Status: AC
Start: 2022-12-12 — End: ?

## 2022-12-12 NOTE — Progress Notes (Signed)
Acute Office Visit  Subjective:     Patient ID: Anna Patel, female    DOB: [birth year], 52 y.o.   MRN: [id number]  Chief Complaint  Patient presents with   Sore Throat    Sore throat started a week ago and has since gotten better but pt states she feels there is something stuck in her throat now. Pt states she just feels like she has a lump in her throat.     HPI Patient is in today for one week of sore throat and post nasal drip. She was on a cruise a little over a week ago and started having ST and congestion. Treated with tylenol/ibuprofen/honey/salt water. ST has improved but still has feeling of "thickness in throat". She denies any fever, chills, nausea, vomiting, cough, body aches, ear pain. She has some congestion feeling from time to time. Feels like she has something "stuck in throat". No choking or problems breathing.   .. Active Ambulatory Problems    Diagnosis Date Noted   OBESITY, NOS 03/07/2006   ESSENTIAL HYPERTENSION, BENIGN 12/15/2008   Systemic lupus erythematosus (SLE) in adult (HCC) 03/07/2006   PROTEINURIA 01/06/2009   JRA (juvenile rheumatoid arthritis) (HCC) 12/16/2011   Vitamin D deficiency 10/20/2016   Hypokalemia 09/05/2018   Long-term use of Plaquenil 02/10/2020   Resolved Ambulatory Problems    Diagnosis Date Noted   URI 04/16/2010   DERMATITIS, ATOPIC 02/27/2007   CERVICAL LYMPHADENOPATHY, ANTERIOR, RIGHT 06/23/2009   Chest tightness 09/04/2018   Past Medical History:  Diagnosis Date   Anal intraepithelial neoplasia    Blood transfusion without reported diagnosis age 76   History of kidney stones    History of pre-eclampsia 2010   Hypertension    SLE (systemic lupus erythematosus) (HCC)    Wears glasses      ROS See HPI.     Objective:    BP 128/86   Pulse 78   Temp 97.9 F (36.6 C)   Ht 5\' 4"  (1.626 m)   Wt 198 lb (89.8 kg)   LMP 02/05/2015 (Exact Date)   SpO2 91%   BMI 33.99 kg/m  BP Readings from Last 3 Encounters:   12/12/22 128/86  09/29/22 129/75  02/21/22 114/74   Wt Readings from Last 3 Encounters:  12/12/22 198 lb (89.8 kg)  09/29/22 195 lb (88.5 kg)  02/21/22 194 lb (88 kg)      Physical Exam Constitutional:      Appearance: She is well-developed. She is obese.  HENT:     Head: Normocephalic.     Comments: No tenderness over maxillary or frontal sinuses  Turbinates red and swollen bilaterally    Right Ear: Tympanic membrane and ear canal normal. No drainage, swelling or tenderness. No middle ear effusion. Tympanic membrane is not erythematous.     Left Ear: Tympanic membrane and ear canal normal. No drainage, swelling or tenderness.  No middle ear effusion. Tympanic membrane is not erythematous.     Mouth/Throat:     Mouth: Mucous membranes are moist.     Pharynx: Uvula midline. Posterior oropharyngeal erythema present. No uvula swelling.     Tonsils: No tonsillar exudate or tonsillar abscesses. 0 on the right. 0 on the left.     Comments: Significant greenish drainage down her oropharynx Eyes:     Conjunctiva/sclera: Conjunctivae normal.  Neck:     Thyroid: No thyromegaly.  Cardiovascular:     Rate and Rhythm: Normal rate.  Pulmonary:  Effort: Pulmonary effort is normal.     Breath sounds: Normal breath sounds.  Neurological:     General: No focal deficit present.     Mental Status: She is alert and oriented to person, place, and time.  Psychiatric:        Mood and Affect: Mood normal.         Assessment & Plan:  Marland KitchenMarland KitchenWiller was seen today for sore throat.  Diagnoses and all orders for this visit:  Rhinosinusitis -     fluticasone (FLONASE) 50 MCG/ACT nasal spray; Place 2 sprays into both nostrils daily. -     amoxicillin-clavulanate (AUGMENTIN) 875-125 MG tablet; Take 1 tablet by mouth 2 (two) times daily.   Treated with augmentin and flonase for rhinosinusitis HO given follow up as needed if symptoms persist or worsen    Return if symptoms worsen or fail to  improve.  Tandy Gaw, PA-C

## 2022-12-12 NOTE — Patient Instructions (Signed)

## 2022-12-26 ENCOUNTER — Other Ambulatory Visit: Payer: Self-pay | Admitting: *Deleted

## 2022-12-26 MED ORDER — LOSARTAN POTASSIUM-HCTZ 100-25 MG PO TABS: 1.0000 | ORAL_TABLET | Freq: Every day | ORAL | 1 refills | Status: DC

## 2023-03-02 ENCOUNTER — Encounter: Payer: 59 | Admitting: Family Medicine

## 2023-03-02 ENCOUNTER — Ambulatory Visit: Payer: 59 | Admitting: Family Medicine

## 2023-03-02 ENCOUNTER — Other Ambulatory Visit (HOSPITAL_COMMUNITY)
Admission: RE | Admit: 2023-03-02 | Discharge: 2023-03-02 | Disposition: A | Discharge status: Home / Self Care | Payer: 59 | Source: Ambulatory Visit | Attending: Family Medicine | Admitting: Family Medicine

## 2023-03-02 ENCOUNTER — Encounter: Payer: Self-pay | Admitting: Family Medicine

## 2023-03-02 VITALS — BP 129/85 | HR 71 | Ht 64.0 in | Wt 200.0 lb

## 2023-03-02 DIAGNOSIS — Z0000 Encounter for general adult medical examination without abnormal findings: Secondary | ICD-10-CM | POA: Insufficient documentation

## 2023-03-02 DIAGNOSIS — Z23 Encounter for immunization: Secondary | ICD-10-CM | POA: Diagnosis not present

## 2023-03-02 NOTE — Progress Notes (Signed)
Complete physical exam  Patient: Anna Patel   DOB: November 14, 1970   52 y.o. Female  MRN: [id number]  Subjective:    Chief Complaint  Patient presents with   Annual Exam    Annual exam/pap    Anna Patel is a 52 y.o. female who presents today for a complete physical exam. She reports consuming a general diet.  She generally feels well. She reports sleeping fairly well. She does have additional problems to discuss today.   Mammo is scheduled.     Most recent fall risk assessment:    12/12/2022    2:46 PM  Fall Risk   Falls in the past year? 0  Number falls in past yr: 0  Injury with Fall? 0  Risk for fall due to : No Fall Risks  Follow up Falls evaluation completed     Most recent depression screenings:    12/12/2022    2:46 PM 09/29/2022    8:33 AM  PHQ 2/9 Scores  PHQ - 2 Score 0 0        Patient Care Team: Agapito Games, MD as PCP - General (Family Medicine) Vance Peper, MD (Rheumatology)   Outpatient Medications Prior to Visit  Medication Sig   amLODipine (NORVASC) 10 MG tablet TAKE 1 TABLET(10 MG) BY MOUTH DAILY   aspirin EC 81 MG tablet Take 81 mg by mouth daily.   Calcium Carbonate-Vitamin D 500-125 MG-UNIT TABS Take by mouth 2 (two) times daily.   hydroxychloroquine (PLAQUENIL) 200 MG tablet Take by mouth daily.   losartan-hydrochlorothiazide (HYZAAR) 100-25 MG tablet Take 1 tablet by mouth daily.   meloxicam (MOBIC) 15 MG tablet Take 1 tablet (15 mg total) by mouth daily.   Multiple Vitamin (MULTIVITAMIN) tablet Take 1 tablet by mouth daily.   potassium chloride SA (KLOR-CON M) 20 MEQ tablet Take 1 tablet (20 mEq total) by mouth daily.   [DISCONTINUED] amoxicillin-clavulanate (AUGMENTIN) 875-125 MG tablet Take 1 tablet by mouth 2 (two) times daily.   [DISCONTINUED] fluticasone (FLONASE) 50 MCG/ACT nasal spray Place 2 sprays into both nostrils daily.   No facility-administered medications prior to visit.    ROS        Objective:      BP 129/85   Pulse 71   Ht 5\' 4"  (1.626 m)   Wt 200 lb (90.7 kg)   LMP 02/05/2015 (Exact Date)   SpO2 98%   BMI 34.33 kg/m    Physical Exam Vitals and nursing note reviewed. Exam conducted with a chaperone present.  Constitutional:      Appearance: Normal appearance.  HENT:     Head: Normocephalic and atraumatic.  Eyes:     Conjunctiva/sclera: Conjunctivae normal.  Cardiovascular:     Rate and Rhythm: Normal rate and regular rhythm.  Pulmonary:     Effort: Pulmonary effort is normal.     Breath sounds: Normal breath sounds.  Chest:     Chest wall: No mass or deformity.  Breasts:    Right: Normal. No mass, nipple discharge or skin change.     Left: Normal. No mass, nipple discharge or skin change.  Genitourinary:    General: Normal vulva.     Exam position: Supine.     Pubic Area: No rash.      Labia:        Right: No rash, lesion or injury.        Left: No rash, lesion or injury.      Vagina:  Normal.     Cervix: Normal.     Uterus: Normal.      Adnexa: Right adnexa normal and left adnexa normal.     Rectum: Normal.  Lymphadenopathy:     Upper Body:     Right upper body: No supraclavicular, axillary or pectoral adenopathy.     Left upper body: No supraclavicular, axillary or pectoral adenopathy.  Skin:    General: Skin is warm and dry.  Neurological:     Mental Status: She is alert.  Psychiatric:        Mood and Affect: Mood normal.      Results for orders placed or performed in visit on 03/02/23  Lipid panel  Result Value Ref Range   Cholesterol, Total 191 100 - 199 mg/dL   Triglycerides 401 0 - 149 mg/dL   HDL 47 >02 mg/dL   VLDL Cholesterol Cal 20 5 - 40 mg/dL   LDL Chol Calc (NIH) 725 (H) 0 - 99 mg/dL   Chol/HDL Ratio 4.1 0.0 - 4.4 ratio       Assessment & Plan:    Routine Health Maintenance and Physical Exam  Immunization History  Administered Date(s) Administered   Influenza, Seasonal, Injecte, Preservative Fre 03/02/2023    Influenza,inj,Quad PF,6+ Mos 01/25/2013, 02/17/2014, 01/20/2015, 03/17/2016, 02/14/2017, 02/14/2018, 02/18/2019, 02/19/2020, 02/19/2021, 02/21/2022   Influenza-Unspecified 01/25/2013, 02/17/2014, 01/20/2015, 03/17/2016   Janssen (J&J) SARS-COV-2 Vaccination 08/26/2019   PFIZER(Purple Top)SARS-COV-2 Vaccination 02/28/2020   Pfizer(Comirnaty)Fall Seasonal Vaccine 12 years and older 03/02/2023   Td 07/20/2010   Tdap 08/18/2020    Health Maintenance  Topic Date Due   Hepatitis C Screening  Never done   Zoster Vaccines- Shingrix (1 of 2) Never done   Cervical Cancer Screening (Pap smear)  02/22/2023   MAMMOGRAM  03/04/2023   COVID-19 Vaccine (4 - 2023-24 season) 04/27/2023   Colonoscopy  06/29/2030   DTaP/Tdap/Td (3 - Td or Tdap) 08/19/2030   INFLUENZA VACCINE  Completed   HIV Screening  Completed   Pneumococcal Vaccine 54-58 Years old  Aged Out   HPV VACCINES  Aged Out    Discussed health benefits of physical activity, and encouraged her to engage in regular exercise appropriate for her age and condition.  Problem List Items Addressed This Visit   None Visit Diagnoses     Wellness examination    -  Primary   Relevant Orders   Cytology - PAP   Lipid panel (Completed)   Immunization due       Relevant Orders   Flu vaccine trivalent PF, 6mos and older(Flulaval,Afluria,Fluarix,Fluzone) (Completed)   Pfizer Comirnaty Covid -19 Vaccine 35yrs and older (Completed)     Keep up a regular exercise program and make sure you are eating a healthy diet Try to eat 4 servings of dairy a day, or if you are lactose intolerant take a calcium with vitamin D daily.  Your vaccines are up to date.  Pap performed today.   Return in about 6 months (around 08/31/2023) for Hypertension.     Anna Gasser, MD

## 2023-03-03 ENCOUNTER — Encounter: Payer: Self-pay | Admitting: Family Medicine

## 2023-03-03 LAB — LIPID PANEL
Chol/HDL Ratio: 4.1 {ratio} (ref 0.0–4.4)
Cholesterol, Total: 191 mg/dL (ref 100–199)
HDL: 47 mg/dL (ref >39)
LDL Chol Calc (NIH): 124 mg/dL — ABNORMAL HIGH (ref 0–99)
Triglycerides: 113 mg/dL (ref 0–149)
VLDL Cholesterol Cal: 20 mg/dL (ref 5–40)

## 2023-03-03 NOTE — Progress Notes (Signed)
Hi Arlene, LDL is up a little bit this year at 124 goal is under 100.  Total cholesterol and triglycerides look good.

## 2023-03-09 LAB — HM MAMMOGRAPHY

## 2023-03-10 LAB — CYTOLOGY - PAP
Comment: NEGATIVE
Diagnosis: UNDETERMINED — AB
High risk HPV: NEGATIVE

## 2023-03-13 ENCOUNTER — Encounter: Payer: Self-pay | Admitting: Family Medicine

## 2023-03-14 NOTE — Progress Notes (Signed)
HI Anna Patel,  The cells on your Pap smear showed some atypical cells.  But they are negative for the virus called HPV which is great.  So based on current guidelines they would recommend repeating your Pap smear in 1 year along with the HPV testing again.

## 2023-04-11 ENCOUNTER — Encounter: Payer: Self-pay | Admitting: Family Medicine

## 2023-04-11 ENCOUNTER — Ambulatory Visit: Payer: 59 | Admitting: Family Medicine

## 2023-04-11 VITALS — BP 128/79 | HR 74 | Ht 64.0 in | Wt 202.0 lb

## 2023-04-11 DIAGNOSIS — R21 Rash and other nonspecific skin eruption: Secondary | ICD-10-CM

## 2023-04-11 DIAGNOSIS — M0800 Unspecified juvenile rheumatoid arthritis of unspecified site: Secondary | ICD-10-CM

## 2023-04-11 DIAGNOSIS — E876 Hypokalemia: Secondary | ICD-10-CM | POA: Diagnosis not present

## 2023-04-11 DIAGNOSIS — M329 Systemic lupus erythematosus, unspecified: Secondary | ICD-10-CM

## 2023-04-11 MED ORDER — POTASSIUM CHLORIDE CRYS ER 20 MEQ PO TBCR
20.0000 meq | EXTENDED_RELEASE_TABLET | Freq: Every day | ORAL | 1 refills | Status: DC
Start: 2023-04-11 — End: 2024-03-27

## 2023-04-11 NOTE — Progress Notes (Signed)
   Acute Office Visit  Subjective:     Patient ID: Anna Patel, female    DOB: [birth year], 52 y.o.   MRN: [id number]  Chief Complaint  Patient presents with   Rash    HPI Patient is in today for  Pt reports that she has had itching on top of R foot and toes x1 week. Feels that it is spreading. Using cortisone and taking antihistamines  She denies any new shoes detergents lotions etc.  She says it is a little bit itchy.  The over-the-counter hydrocortisone does help a little bit.  She says she thinks it might be drying up compared to what it look like a week ago and it was a little bit more inflamed and red.  No drainage.  Still on Plaquenil for her JRA with rheumatology.  No recent changes to medication regimen.  ROS      Objective:    BP 128/79   Pulse 74   Ht 5\' 4"  (1.626 m)   Wt 202 lb (91.6 kg)   LMP 02/05/2015 (Exact Date)   SpO2 99%   BMI 34.67 kg/m    Physical Exam Vitals reviewed.  Constitutional:      Appearance: Normal appearance.  HENT:     Head: Normocephalic.  Pulmonary:     Effort: Pulmonary effort is normal.  Skin:    Comments: Erythematous papular rash lesions approximately 2 to 3 mm in size.  No drainage or significant scale predominantly just on the top of the right foot.  1+ pitting edema of both feet.  She says this is not new.  Neurological:     Mental Status: She is alert and oriented to person, place, and time.  Psychiatric:        Mood and Affect: Mood normal.        Behavior: Behavior normal.     No results found for any visits on 04/11/23.      Assessment & Plan:   Problem List Items Addressed This Visit       Musculoskeletal and Integument   JRA (juvenile rheumatoid arthritis) (HCC)    Followed by Rheumatology at [facility]. On plaquenil.         Other   Systemic lupus erythematosus (SLE) in adult [facility])   Hypokalemia   Relevant Medications   potassium chloride SA (KLOR-CON M) 20 MEQ tablet   Other Visit Diagnoses      Rash of foot    -  Primary      Rash --Skin scraping performed will call with results once available.  If negative for any type of fungal elements we will treat with a stronger topical steroid cream.  Call if any new symptoms or worsening or spread of rash.  Meds ordered this encounter  Medications   potassium chloride SA (KLOR-CON M) 20 MEQ tablet    Sig: Take 1 tablet (20 mEq total) by mouth daily.    Dispense:  90 tablet    Refill:  1    No follow-ups on file.  Nani Gasser, MD

## 2023-04-11 NOTE — Assessment & Plan Note (Signed)
Followed by Rheumatology at [facility]. On plaquenil.

## 2023-04-11 NOTE — Addendum Note (Signed)
Addended by: Deno Etienne on: 04/11/2023 03:38 PM   Modules accepted: Orders

## 2023-04-11 NOTE — Progress Notes (Signed)
Pt reports that she has had itching on top of R foot and toes x1 week. Feels that it is spreading. Using cortisone and taking antihistamines

## 2023-04-15 LAB — FUNGUS STAIN

## 2023-04-18 MED ORDER — CLOBETASOL PROPIONATE 0.05 % EX CREA: 1.0000 | TOPICAL_CREAM | Freq: Two times a day (BID) | CUTANEOUS | 0 refills | Status: DC

## 2023-04-18 NOTE — Addendum Note (Signed)
Addended by: Nani Gasser D on: 04/18/2023 05:29 PM   Modules accepted: Orders

## 2023-04-18 NOTE — Progress Notes (Signed)
Hi Anna Patel, good news no sign of fungal elements to the skin scraping in a send over a topical steroid we will see if this helps improve the rash over the next 2 weeks.  If it is not improving then please let me know.  Orders Placed This Encounter      clobetasol cream (TEMOVATE) 0.05 %         Sig: Apply 1 Application topically 2 (two) times daily. X 2 weeks and then PRN         Dispense:  45 g         Refill:  0

## 2023-05-10 ENCOUNTER — Other Ambulatory Visit: Payer: Self-pay | Admitting: *Deleted

## 2023-05-10 DIAGNOSIS — I10 Essential (primary) hypertension: Secondary | ICD-10-CM

## 2023-05-10 MED ORDER — AMLODIPINE BESYLATE 10 MG PO TABS: 10.0000 mg | ORAL_TABLET | Freq: Every day | ORAL | 1 refills | Status: DC

## 2023-05-26 ENCOUNTER — Ambulatory Visit: Payer: 59 | Admitting: Podiatry

## 2023-05-26 DIAGNOSIS — M19071 Primary osteoarthritis, right ankle and foot: Secondary | ICD-10-CM

## 2023-05-26 MED ORDER — DEXAMETHASONE SODIUM PHOSPHATE 120 MG/30ML IJ SOLN
4.0000 mg | Freq: Once | INTRAMUSCULAR | Status: AC
  Administered 2023-05-26: 4 mg via INTRA_ARTICULAR

## 2023-05-26 MED ORDER — TRIAMCINOLONE ACETONIDE 10 MG/ML IJ SUSP
2.5000 mg | Freq: Once | INTRAMUSCULAR | Status: AC
  Administered 2023-05-26: 2.5 mg via INTRA_ARTICULAR

## 2023-05-26 NOTE — Progress Notes (Signed)
  Subjective:  Patient ID: Anna Patel, female    DOB: [birth year],   MRN: [id number]  No chief complaint on file.   52 y.o. female presents for follow-up of midfoot arthritis. Had a shot with Dr. Charlsie Merles over the summer. She relates she is here for another one today.  She does have a history of lupus. Denies any other pedal complaints. Denies n/v/f/c.   Past Medical History:  Diagnosis Date   Anal intraepithelial neoplasia    Blood transfusion without reported diagnosis age 33   due to lupus    History of kidney stones    History of pre-eclampsia 2010   Hypertension    JRA (juvenile rheumatoid arthritis) (HCC)    SLE (systemic lupus erythematosus) (HCC)    Wears glasses    for reading    Objective:  Physical Exam: Vascular: DP/PT pulses 2/4 bilateral. CFT <3 seconds. Normal hair growth on digits. No edema.  Skin. No lacerations or abrasions bilateral feet.  Musculoskeletal: MMT 5/5 bilateral lower extremities in DF, PF, Inversion and Eversion. Deceased ROM in DF of ankle joint. Tender over second and third TMTJ. Some pain with ROM of the second ray. No pain over ankle or rest of foot area. No pain along first metatarsal or medial joints.  Neurological: Sensation intact to light touch.   Assessment:   1. Arthritis of right midfoot      Plan:  Patient was evaluated and treated and all questions answered. Discussed midfoot arthritis with patient and treatment options.  Reviewed notes from Dr. Linford Arnold X-rays reviewed. No acute fractures or dislocations. Degenerative changes noted throughout midfoot. Mostly in second and third TMTJ. Mild pes planus noted.  Discussed NSAIDS, topicals, and possible injections.  Continue meloxicam as needed for foot.  Injection provided procedure below.  Discussed stiff soled shoes and CMO patient will look into.   Discussed if pain does not improve can discuss surgical options.  Patient to follow-up as needed.    Procedure: Injection  Tendon/Ligament Discussed alternatives, risks, complications and verbal consent was obtained.  Location: Right second and third tarsometatarsal joint . Skin Prep: Alcohol. Injectate: 1cc 0.5% marcaine plain, 1 cc dexamethasone 0.5 cc kenalog  Disposition: Patient tolerated procedure well. Injection site dressed with a band-aid.  Post-injection care was discussed and return precautions discussed.    Louann Sjogren, DPM

## 2023-09-04 ENCOUNTER — Encounter: Payer: Self-pay | Admitting: Family Medicine

## 2023-09-04 ENCOUNTER — Ambulatory Visit

## 2023-09-04 ENCOUNTER — Ambulatory Visit: Payer: 59 | Admitting: Family Medicine

## 2023-09-04 VITALS — BP 112/79 | HR 74 | Ht 64.0 in | Wt 202.0 lb

## 2023-09-04 DIAGNOSIS — M545 Low back pain, unspecified: Secondary | ICD-10-CM

## 2023-09-04 DIAGNOSIS — I10 Essential (primary) hypertension: Secondary | ICD-10-CM | POA: Diagnosis not present

## 2023-09-04 DIAGNOSIS — M25562 Pain in left knee: Secondary | ICD-10-CM | POA: Diagnosis not present

## 2023-09-04 DIAGNOSIS — M329 Systemic lupus erythematosus, unspecified: Secondary | ICD-10-CM | POA: Diagnosis not present

## 2023-09-04 DIAGNOSIS — G8929 Other chronic pain: Secondary | ICD-10-CM | POA: Insufficient documentation

## 2023-09-04 NOTE — Assessment & Plan Note (Signed)
 Follows with her rheumatologist regularly.  She is currently on Plaquenil for symptom control.

## 2023-09-04 NOTE — Assessment & Plan Note (Signed)
 Pressure looks fantastic today continue current regimen she does have some labs on file from March so we did not repeat any blood work today.

## 2023-09-04 NOTE — Assessment & Plan Note (Signed)
 Discussed next step options such as formal physical therapy.  They can offer multiple modalities to reduce her pain also recommend a sit/stand desk at work.  Happy to fill out any accommodation forms needed for that.  If continuing to not improve then we can consider further workup or MRI if needed.

## 2023-09-04 NOTE — Progress Notes (Signed)
 Established Patient Office Visit  Subjective  Patient ID: TASHEIKA KITZMILLER, female    DOB: 1970-12-20  Age: 53 y.o. MRN: [id number]  Chief Complaint  Patient presents with   Medical Management of Chronic Issues    HTN 6 mon f/u    HPI  Hypertension- Pt denies chest pain, SOB, dizziness, or heart palpitations.  Taking meds as directed w/o problems.  Denies medication side effects.    Also still struggling with back pain. Mostly low back.  She had mentioned it previously and we did get x-ray in May 2024.  She had mentioned it to her lupus specialist as well and they also recommended some home stretches and exercises.  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  Also having some left knee pain.  left knee pain behind the knee x 1 mo. No injury. Comes and goes. Feels tight at times.  She is notices it mostly after she has been sitting and then tries to stand up it bothers her behind the knee mostly.  She was worried that it could be a blood clot.     ROS    Objective:     BP 112/79 (BP Location: Left Arm, Patient Position: Sitting, Cuff Size: Large)   Pulse 74   Ht 5\' 4"  (1.626 m)   Wt 202 lb (91.6 kg)   LMP 02/05/2015 (Exact Date)   SpO2 96%   BMI 34.67 kg/m    Physical Exam Vitals and nursing note reviewed.  Constitutional:      Appearance: Normal appearance.  HENT:     Head: Normocephalic and atraumatic.  Eyes:     Conjunctiva/sclera: Conjunctivae normal.  Cardiovascular:     Rate and Rhythm: Normal rate and regular rhythm.  Pulmonary:     Effort: Pulmonary effort is normal.     Breath sounds: Normal breath sounds.  Skin:    General: Skin is warm and dry.  Neurological:     Mental Status: She is alert and oriented to person, place, and time.  Psychiatric:        Mood and  Affect: Mood normal.        Behavior: Behavior normal.      No results found for any visits on 09/04/23.    The 10-year ASCVD risk score (Arnett DK, et al., 2019) is: 2.4%    Assessment & Plan:   Problem List Items Addressed This Visit       Cardiovascular and Mediastinum   Essential hypertension, benign - Primary   Pressure looks fantastic today continue current regimen she does have some labs on file from March so we did not repeat any blood work today.        Other   Systemic lupus erythematosus (SLE) in adult [facility])   Follows with her rheumatologist regularly.  She is currently on Plaquenil for symptom control.      Chronic midline low back pain without sciatica   Discussed next step options such as formal physical therapy.  They can offer multiple modalities to reduce her pain also recommend a sit/stand desk at work.  Happy to fill out any accommodation forms needed for that.  If continuing to not improve then we can consider further workup or MRI if needed.  Acute pain of left knee   Is getting in with Dr. Benjamin Stain or sports med doc for her left knee.  Did go ahead and get plain films today so that he could have those available for review.  Assured her that it does not seem consistent with the pattern of a blood clot.  I think she could have just some swelling behind the knee or maybe even a Baker's cyst.      Relevant Orders   DG Knee Complete 4 Views Left    Return in about 6 months (around 03/05/2024) for Wellness Exam.    Nani Gasser, MD

## 2023-09-04 NOTE — Assessment & Plan Note (Signed)
 Is getting in with Dr. Benjamin Stain or sports med doc for her left knee.  Did go ahead and get plain films today so that he could have those available for review.  Assured her that it does not seem consistent with the pattern of a blood clot.  I think she could have just some swelling behind the knee or maybe even a Baker's cyst.

## 2023-09-04 NOTE — Patient Instructions (Addendum)
 If you decide to do PT for your back let me know and I can put in a referral Check with your work to see if they will let you have a sit stand desk.   Please make an appt with Dr. Benjamin Stain for your left knee

## 2023-09-05 ENCOUNTER — Encounter: Payer: Self-pay | Admitting: Family Medicine

## 2023-09-05 NOTE — Progress Notes (Signed)
 X-ray looks okay of the knee.  They do not see any acute fracture or significant degeneration.  This will be helpful for Dr. Karie Schwalbe when you get in with him.

## 2023-09-07 ENCOUNTER — Other Ambulatory Visit: Payer: Self-pay | Admitting: *Deleted

## 2023-09-07 MED ORDER — LOSARTAN POTASSIUM-HCTZ 100-25 MG PO TABS: 1.0000 | ORAL_TABLET | Freq: Every day | ORAL | 1 refills | Status: DC

## 2023-09-19 ENCOUNTER — Encounter: Payer: Self-pay | Admitting: Sports Medicine

## 2023-09-19 ENCOUNTER — Ambulatory Visit: Admitting: Sports Medicine

## 2023-09-19 DIAGNOSIS — M25562 Pain in left knee: Secondary | ICD-10-CM

## 2023-09-19 DIAGNOSIS — G8929 Other chronic pain: Secondary | ICD-10-CM

## 2023-09-19 MED ORDER — MELOXICAM 15 MG PO TABS
ORAL_TABLET | ORAL | 3 refills | Status: AC
Start: 2023-09-19 — End: ?

## 2023-09-19 MED ORDER — ACETAMINOPHEN ER 650 MG PO TBCR
650.0000 mg | EXTENDED_RELEASE_TABLET | Freq: Three times a day (TID) | ORAL | Status: AC | PRN
Start: 2023-09-19 — End: ?

## 2023-09-19 NOTE — Patient Instructions (Signed)
 Anna Patel

## 2023-09-19 NOTE — Progress Notes (Signed)
    Procedures performed today:    None.  Independent interpretation of notes and tests performed by another provider:   None.  Brief History, Exam, Impression, and Recommendations:    Chronic pain of left knee Very pleasant 53 year old female, chronic pain left knee medial joint line, posteriorly, no mechanical symptoms, no trauma. X-rays were negative. Moderate gelling. Exam normal with the exception of minimal joint line pain. I explained the anatomy and pathophysiology of what is likely early osteoarthritis. We will start conservatively with arthritis Tylenol  2-3 times daily, meloxicam  daily for 2 weeks and then as needed. Home physical therapy, return to see me in 6 weeks.    ____________________________________________ Joselyn Nicely. Sandy Crumb, M.D., ABFM., CAQSM., AME. Primary Care and Sports Medicine [facility]  Adjunct Professor of [facility] [employer] [facility] [facility] Medicine  [employer]  Restaurant manager, fast food

## 2023-09-19 NOTE — Assessment & Plan Note (Signed)
 Very pleasant 53 year old female, chronic pain left knee medial joint line, posteriorly, no mechanical symptoms, no trauma. X-rays were negative. Moderate gelling. Exam normal with the exception of minimal joint line pain. I explained the anatomy and pathophysiology of what is likely early osteoarthritis. We will start conservatively with arthritis Tylenol  2-3 times daily, meloxicam  daily for 2 weeks and then as needed. Home physical therapy, return to see me in 6 weeks.

## 2023-10-31 ENCOUNTER — Ambulatory Visit: Admitting: Sports Medicine

## 2023-11-24 ENCOUNTER — Ambulatory Visit: Admitting: Urgent Care

## 2023-11-24 ENCOUNTER — Encounter: Payer: Self-pay | Admitting: Urgent Care

## 2023-11-24 VITALS — BP 107/75 | HR 85 | Resp 18 | Ht 64.0 in | Wt 203.8 lb

## 2023-11-24 DIAGNOSIS — N758 Other diseases of Bartholin's gland: Secondary | ICD-10-CM

## 2023-11-24 MED ORDER — DOXYCYCLINE HYCLATE 100 MG PO TABS: 100.0000 mg | ORAL_TABLET | Freq: Two times a day (BID) | ORAL | 0 refills | Status: DC

## 2023-11-24 NOTE — Progress Notes (Signed)
 Established Patient Office Visit  Subjective:  Patient ID: Anna Patel, female    DOB: [birth year]  Age: 53 y.o. MRN: [id number]  Chief Complaint  Patient presents with   Genital Warts    Pt states she noticed a bump about 1 wk ago. That is very tender, no drainage or discharge    Pleasant 53yo female presents today with concerns of a painful bump on the R labia. States it started about a week ago. She was concerned it was an ingrown hair, but was unable to remove a hair. She denies fever. No pelvic pain or discharge. No UTI sx. She reports it is getting more tender, but no discharge or drainage. No treatments tried.     Patient Active Problem List   Diagnosis Date Noted   Chronic midline low back pain without sciatica 09/04/2023   Chronic pain of left knee 09/04/2023   Long-term use of Plaquenil 02/10/2020   Hypokalemia 09/05/2018   Vitamin D  deficiency 10/20/2016   JRA (juvenile rheumatoid arthritis) (HCC) 12/16/2011   PROTEINURIA 01/06/2009   Essential hypertension, benign 12/15/2008   OBESITY, NOS 03/07/2006   Systemic lupus erythematosus (SLE) in adult [facility]) 03/07/2006   Past Medical History:  Diagnosis Date   Anal intraepithelial neoplasia    Blood transfusion without reported diagnosis age 61   due to lupus    History of kidney stones    History of pre-eclampsia 2010   Hypertension    JRA (juvenile rheumatoid arthritis) (HCC)    SLE (systemic lupus erythematosus) (HCC)    Wears glasses    for reading   Past Surgical History:  Procedure Laterality Date   ANAL INTRAEPITHELIAL NEOPLASIA EXCISION N/A 09/24/2020   Procedure: EXCISION ANAL INTRAEPITHELIAL NEOPLASIA;  Surgeon: Debby Hila, MD;  Location: [facility];  Service: General;  Laterality: N/A;   RECTAL EXAM UNDER ANESTHESIA N/A 09/24/2020   Procedure: RECTAL EXAM UNDER ANESTHESIA;  Surgeon: Debby Hila, MD;  Location: [facility] [place];  Service: General;  Laterality: N/A;    TUBAL LIGATION  2010   Social History   Tobacco Use   Smoking status: Never   Smokeless tobacco: Never  Vaping Use   Vaping status: Never Used  Substance Use Topics   Alcohol use: No   Drug use: No      ROS: as noted in HPI  Objective:     BP 107/75 (BP Location: Left Arm, Patient Position: Sitting, Cuff Size: Large)   Pulse 85   Resp 18   Ht 5' 4 (1.626 m)   Wt 203 lb 12 oz (92.4 kg)   LMP 02/05/2015 (Exact Date)   SpO2 99%   BMI 34.97 kg/m  BP Readings from Last 3 Encounters:  11/24/23 107/75  09/04/23 112/79  04/11/23 128/79   Wt Readings from Last 3 Encounters:  11/24/23 203 lb 12 oz (92.4 kg)  09/04/23 202 lb (91.6 kg)  04/11/23 202 lb (91.6 kg)      Physical Exam Vitals and nursing note reviewed. Exam conducted with a chaperone present.  Constitutional:      General: She is not in acute distress.    Appearance: Normal appearance. She is not ill-appearing, toxic-appearing or diaphoretic.   Cardiovascular:     Rate and Rhythm: Normal rate.  Genitourinary:    Pubic Area: No rash or pubic lice.      Labia:        Right: Tenderness present. No rash, lesion or injury.  Left: No rash, tenderness, lesion or injury.      Urethra: No prolapse, urethral pain, urethral swelling or urethral lesion.     Vagina: Normal. No vaginal discharge or lesions.     Cervix: Normal. No cervical motion tenderness, discharge, friability or lesion.     Uterus: Normal.      Adnexa: Right adnexa normal and left adnexa normal.       Right: No mass, tenderness or fullness.         Left: No mass, tenderness or fullness.       Rectum: Normal.   Lymphadenopathy:     Lower Body: No right inguinal adenopathy. No left inguinal adenopathy.   Neurological:     Mental Status: She is alert.      No results found for any visits on 11/24/23.    The 10-year ASCVD risk score (Arnett DK, et al., 2019) is: 2.2%  Assessment & Plan:  Bartholin's gland infection -      Doxycycline Hyclate; Take 1 tablet (100 mg total) by mouth 2 (two) times daily.  Dispense: 20 tablet; Refill: 0 -     Ambulatory referral to Gynecology  Suspect infected bartholins gland cyst at present time. Does not appear to be an abscess. Will have pt start PO doxycycline and apply warm compresses. NSAIDs for pain relief as needed. Trial of sitz baths. Gynecology referral placed in the instance that this becomes an abscess and may required drainage.    No follow-ups on file.   Benton LITTIE Gave, PA

## 2023-11-24 NOTE — Patient Instructions (Signed)
 Please apply warm compresses to the affected area of the vagina. Take the doxycycline twice daily x 10 days. Consider using a Sitz bath.  If the symptoms continue to get worse, please follow up with gynecology.

## 2024-01-09 ENCOUNTER — Other Ambulatory Visit: Payer: Self-pay | Admitting: *Deleted

## 2024-01-09 DIAGNOSIS — I10 Essential (primary) hypertension: Secondary | ICD-10-CM

## 2024-01-09 MED ORDER — AMLODIPINE BESYLATE 10 MG PO TABS: 10.0000 mg | ORAL_TABLET | Freq: Every day | ORAL | 1 refills | Status: AC

## 2024-01-30 ENCOUNTER — Encounter: Payer: Self-pay | Admitting: Sports Medicine

## 2024-03-04 ENCOUNTER — Other Ambulatory Visit (HOSPITAL_COMMUNITY)
Admission: RE | Admit: 2024-03-04 | Discharge: 2024-03-04 | Disposition: A | Source: Ambulatory Visit | Attending: Family Medicine | Admitting: Family Medicine

## 2024-03-04 ENCOUNTER — Ambulatory Visit: Admitting: Family Medicine

## 2024-03-04 ENCOUNTER — Encounter: Payer: Self-pay | Admitting: Family Medicine

## 2024-03-04 VITALS — BP 126/84 | HR 76 | Temp 98.4°F | Ht 64.0 in | Wt 200.0 lb

## 2024-03-04 DIAGNOSIS — Z124 Encounter for screening for malignant neoplasm of cervix: Secondary | ICD-10-CM

## 2024-03-04 DIAGNOSIS — R87610 Atypical squamous cells of undetermined significance on cytologic smear of cervix (ASC-US): Secondary | ICD-10-CM | POA: Diagnosis present

## 2024-03-04 DIAGNOSIS — Z0000 Encounter for general adult medical examination without abnormal findings: Secondary | ICD-10-CM | POA: Insufficient documentation

## 2024-03-04 DIAGNOSIS — Z23 Encounter for immunization: Secondary | ICD-10-CM

## 2024-03-04 DIAGNOSIS — E010 Iodine-deficiency related diffuse (endemic) goiter: Secondary | ICD-10-CM

## 2024-03-04 DIAGNOSIS — M7989 Other specified soft tissue disorders: Secondary | ICD-10-CM | POA: Diagnosis not present

## 2024-03-04 DIAGNOSIS — Z1159 Encounter for screening for other viral diseases: Secondary | ICD-10-CM

## 2024-03-04 DIAGNOSIS — Z1231 Encounter for screening mammogram for malignant neoplasm of breast: Secondary | ICD-10-CM

## 2024-03-04 NOTE — Progress Notes (Addendum)
 Complete physical exam  Patient: JERMAINE THOLL   DOB: Jan 24, 1971   53 y.o. Female  MRN: [id number]  Subjective:    Chief Complaint  Patient presents with   Annual Exam    Including pap Experiencing some swelling in ankles and feet    ALYANNAH SANKS is a 53 y.o. female who presents today for a complete physical exam. She reports consuming a general diet. Some walking for exercise  She generally feels fairly well. She reports sleeping fairly well. She does have additional problems to discuss today.   Sweling feet, ankles and face on and off for months. Feels like left side of face and jaw is swollen lately.    Most recent fall risk assessment:    12/12/2022    2:46 PM  Fall Risk   Falls in the past year? 0  Number falls in past yr: 0  Injury with Fall? 0  Risk for fall due to : No Fall Risks  Follow up Falls evaluation completed     Most recent depression screenings:    12/12/2022    2:46 PM 09/29/2022    8:33 AM  PHQ 2/9 Scores  PHQ - 2 Score 0 0         Patient Care Team: Alvan Dorothyann JONETTA, MD as PCP - General (Family Medicine) Johnson Rockey ORN, MD (Rheumatology)   Outpatient Medications Prior to Visit  Medication Sig   acetaminophen  (TYLENOL ) 650 MG CR tablet Take 1 tablet (650 mg total) by mouth every 8 (eight) hours as needed for pain.   amLODipine  (NORVASC ) 10 MG tablet Take 1 tablet (10 mg total) by mouth daily.   aspirin EC 81 MG tablet Take 81 mg by mouth daily.   Calcium Carbonate-Vitamin D  500-125 MG-UNIT TABS Take by mouth 2 (two) times daily.   hydroxychloroquine (PLAQUENIL) 200 MG tablet Take by mouth daily.   losartan -hydrochlorothiazide (HYZAAR) 100-25 MG tablet Take 1 tablet by mouth daily.   meloxicam  (MOBIC ) 15 MG tablet One tab PO every 24 hours with a meal for 2 weeks, then once every 24 hours prn pain.   Multiple Vitamin (MULTIVITAMIN) tablet Take 1 tablet by mouth daily.   [DISCONTINUED] clobetasol  cream (TEMOVATE ) 0.05 % Apply 1  Application topically 2 (two) times daily. X 2 weeks and then PRN   [DISCONTINUED] doxycycline  (VIBRA -TABS) 100 MG tablet Take 1 tablet (100 mg total) by mouth 2 (two) times daily.   [DISCONTINUED] potassium chloride  SA (KLOR-CON  M) 20 MEQ tablet Take 1 tablet (20 mEq total) by mouth daily.   No facility-administered medications prior to visit.    ROS        Objective:     BP 126/84 (BP Location: Left Arm, Patient Position: Sitting, Cuff Size: Normal)   Pulse 76   Temp 98.4 F (36.9 C) (Oral)   Ht 5' 4 (1.626 m)   Wt 200 lb (90.7 kg)   LMP 02/05/2015 (Exact Date)   SpO2 100%   BMI 34.33 kg/m     Physical Exam Constitutional:      Appearance: Normal appearance.  HENT:     Head: Normocephalic and atraumatic.     Right Ear: Tympanic membrane, ear canal and external ear normal.     Left Ear: Tympanic membrane, ear canal and external ear normal.     Nose: Nose normal.     Mouth/Throat:     Pharynx: Oropharynx is clear.  Eyes:     Extraocular Movements: Extraocular movements intact.  Conjunctiva/sclera: Conjunctivae normal.     Pupils: Pupils are equal, round, and reactive to light.  Neck:     Thyroid: No thyromegaly.     Comments: Thyroid assymetry Cardiovascular:     Rate and Rhythm: Normal rate and regular rhythm.  Pulmonary:     Effort: Pulmonary effort is normal.     Breath sounds: Normal breath sounds.  Abdominal:     General: Bowel sounds are normal.     Palpations: Abdomen is soft.     Tenderness: There is no abdominal tenderness.  Musculoskeletal:        General: No swelling.     Cervical back: Neck supple.  Skin:    General: Skin is warm and dry.  Neurological:     Mental Status: She is oriented to person, place, and time.  Psychiatric:        Mood and Affect: Mood normal.        Behavior: Behavior normal.      Results for orders placed or performed in visit on 03/04/24  Lipid Panel With LDL/HDL Ratio  Result Value Ref Range   Cholesterol,  Total 188 100 - 199 mg/dL   Triglycerides 94 0 - 149 mg/dL   HDL 51 >60 mg/dL   VLDL Cholesterol Cal 17 5 - 40 mg/dL   LDL Chol Calc (NIH) 879 (H) 0 - 99 mg/dL   LDL/HDL Ratio 2.4 0.0 - 3.2 ratio  CMP14+EGFR  Result Value Ref Range   Glucose 86 70 - 99 mg/dL   BUN 10 6 - 24 mg/dL   Creatinine, Ser 9.06 0.57 - 1.00 mg/dL   eGFR 74 >40 fO/fpw/8.26   BUN/Creatinine Ratio 11 9 - 23   Sodium 143 134 - 144 mmol/L   Potassium 3.5 3.5 - 5.2 mmol/L   Chloride 102 96 - 106 mmol/L   CO2 26 20 - 29 mmol/L   Calcium 9.5 8.7 - 10.2 mg/dL   Total Protein 7.8 6.0 - 8.5 g/dL   Albumin 4.1 3.8 - 4.9 g/dL   Globulin, Total 3.7 1.5 - 4.5 g/dL   Bilirubin Total 0.4 0.0 - 1.2 mg/dL   Alkaline Phosphatase 116 49 - 135 IU/L   AST 19 0 - 40 IU/L   ALT 16 0 - 32 IU/L  HgB A1c  Result Value Ref Range   Hgb A1c MFr Bld 5.8 (H) 4.8 - 5.6 %   Est. average glucose Bld gHb Est-mCnc 120 mg/dL  Vitamin D  (25 hydroxy)  Result Value Ref Range   Vit D, 25-Hydroxy 65.0 30.0 - 100.0 ng/mL  CBC  Result Value Ref Range   WBC 7.1 3.4 - 10.8 x10E3/uL   RBC 5.66 (H) 3.77 - 5.28 x10E6/uL   Hemoglobin 15.2 11.1 - 15.9 g/dL   Hematocrit 52.4 (H) 65.9 - 46.6 %   MCV 84 79 - 97 fL   MCH 26.9 26.6 - 33.0 pg   MCHC 32.0 31.5 - 35.7 g/dL   RDW 86.1 88.2 - 84.5 %   Platelets 247 150 - 450 x10E3/uL  TSH  Result Value Ref Range   TSH 1.420 0.450 - 4.500 uIU/mL  Hepatitis C Antibody  Result Value Ref Range   Hep C Virus Ab Non Reactive Non Reactive  Urine Microalbumin w/creat. ratio  Result Value Ref Range   Creatinine, Urine 330.7 Not Estab. mg/dL   Microalbumin, Urine 11.6 Not Estab. ug/mL   Microalb/Creat Ratio 27 0 - 29 mg/g creat  Specimen status report  Result Value  Ref Range   specimen status report Comment   Cytology - PAP  Result Value Ref Range   High risk HPV Negative    Neisseria Gonorrhea Negative    Chlamydia Negative    Trichomonas Negative    Adequacy      Satisfactory for evaluation;  transformation zone component PRESENT.   Diagnosis      - Negative for intraepithelial lesion or malignancy (NILM)   Comment Normal Reference Range HPV - Negative    Comment Normal Reference Ranger Chlamydia - Negative    Comment      Normal Reference Range Neisseria Gonorrhea - Negative   Comment Normal Reference Range Trichomonas - Negative         Assessment & Plan:    Routine Health Maintenance and Physical Exam  Immunization History  Administered Date(s) Administered   Influenza, Seasonal, Injecte, Preservative Fre 03/02/2023, 03/04/2024   Influenza,inj,Quad PF,6+ Mos 01/25/2013, 02/17/2014, 01/20/2015, 03/17/2016, 02/14/2017, 02/14/2018, 02/18/2019, 02/19/2020, 02/19/2021, 02/21/2022   Influenza-Unspecified 01/25/2013, 02/17/2014, 01/20/2015, 03/17/2016   Janssen (J&J) SARS-COV-2 Vaccination 08/26/2019   PFIZER(Purple Top)SARS-COV-2 Vaccination 02/28/2020   PNEUMOCOCCAL CONJUGATE-20 03/04/2024   Pfizer(Comirnaty)Fall Seasonal Vaccine 12 years and older 03/02/2023, 03/04/2024   Td 07/20/2010   Tdap 08/18/2020    Health Maintenance  Topic Date Due   Hepatitis B Vaccines 19-59 Average Risk (1 of 3 - 19+ 3-dose series) Never done   Zoster Vaccines- Shingrix (1 of 2) Never done   COVID-19 Vaccine (5 - 2025-26 season) 09/02/2024   Mammogram  03/11/2025   Cervical Cancer Screening (HPV/Pap Cotest)  03/04/2029   Colonoscopy  06/29/2030   DTaP/Tdap/Td (3 - Td or Tdap) 08/19/2030   Pneumococcal Vaccine: 50+ Years  Completed   Influenza Vaccine  Completed   Hepatitis C Screening  Completed   HIV Screening  Completed   HPV VACCINES  Aged Out   Meningococcal B Vaccine  Aged Out    Discussed health benefits of physical activity, and encouraged her to engage in regular exercise appropriate for her age and condition.  Problem List Items Addressed This Visit   None Visit Diagnoses       Wellness examination    -  Primary   Relevant Orders   Cytology - PAP (Completed)    Lipid Panel With LDL/HDL Ratio (Completed)   CMP14+EGFR (Completed)   HgB A1c (Completed)   Vitamin D  (25 hydroxy) (Completed)   CBC (Completed)   TSH (Completed)   Hepatitis C Antibody (Completed)     Screening for cervical cancer       Relevant Orders   Cytology - PAP (Completed)     ASCUS of cervix with negative high risk HPV       Relevant Orders   Cytology - PAP (Completed)     Encounter for hepatitis C screening test for low risk patient       Relevant Orders   Hepatitis C Antibody (Completed)     Screening mammogram for breast cancer         Swelling of both lower extremities       Relevant Orders   CMP14+EGFR (Completed)   CBC (Completed)   TSH (Completed)   Urine Microalbumin w/creat. ratio (Completed)     Thyromegaly       Relevant Orders   US  THYROID (Completed)     Immunization due       Relevant Orders   Flu vaccine trivalent PF, 6mos and older(Flulaval,Afluria,Fluarix,Fluzone) (Completed)   Pneumococcal conjugate vaccine 20-valent (Prevnar 20) (  Completed)   Pfizer Comirnaty Covid -19 Vaccine 63yrs and older (Completed)       Swelling - will check labs.  She is already on hydrochlorothiazide.  Swelling worse in Right foot where she has more arthritis.    Thyroid asymmetry - will schedule for US .    Mammo is scheduled  Pap repeated today.  Will call. Prior with ASCUS.   Return in about 1 year (around 03/04/2025) for Wellness Exam.     Dorothyann Byars, MD

## 2024-03-05 LAB — CMP14+EGFR
ALT: 16 IU/L (ref 0–32)
AST: 19 IU/L (ref 0–40)
Albumin: 4.1 g/dL (ref 3.8–4.9)
Alkaline Phosphatase: 116 IU/L (ref 49–135)
BUN/Creatinine Ratio: 11 (ref 9–23)
BUN: 10 mg/dL (ref 6–24)
Bilirubin Total: 0.4 mg/dL (ref 0.0–1.2)
CO2: 26 mmol/L (ref 20–29)
Calcium: 9.5 mg/dL (ref 8.7–10.2)
Chloride: 102 mmol/L (ref 96–106)
Creatinine, Ser: 0.93 mg/dL (ref 0.57–1.00)
Globulin, Total: 3.7 g/dL (ref 1.5–4.5)
Glucose: 86 mg/dL (ref 70–99)
Potassium: 3.5 mmol/L (ref 3.5–5.2)
Sodium: 143 mmol/L (ref 134–144)
Total Protein: 7.8 g/dL (ref 6.0–8.5)
eGFR: 74 mL/min/1.73 (ref >59)

## 2024-03-05 LAB — CBC
Hematocrit: 47.5 % — ABNORMAL HIGH (ref 34.0–46.6)
Hemoglobin: 15.2 g/dL (ref 11.1–15.9)
MCH: 26.9 pg (ref 26.6–33.0)
MCHC: 32 g/dL (ref 31.5–35.7)
MCV: 84 fL (ref 79–97)
Platelets: 247 x10E3/uL (ref 150–450)
RBC: 5.66 x10E6/uL — ABNORMAL HIGH (ref 3.77–5.28)
RDW: 13.8 % (ref 11.7–15.4)
WBC: 7.1 x10E3/uL (ref 3.4–10.8)

## 2024-03-05 LAB — LIPID PANEL WITH LDL/HDL RATIO
Cholesterol, Total: 188 mg/dL (ref 100–199)
HDL: 51 mg/dL (ref >39)
LDL Chol Calc (NIH): 120 mg/dL — ABNORMAL HIGH (ref 0–99)
LDL/HDL Ratio: 2.4 ratio (ref 0.0–3.2)
Triglycerides: 94 mg/dL (ref 0–149)
VLDL Cholesterol Cal: 17 mg/dL (ref 5–40)

## 2024-03-05 LAB — HEMOGLOBIN A1C
Est. average glucose Bld gHb Est-mCnc: 120 mg/dL
Hgb A1c MFr Bld: 5.8 % — ABNORMAL HIGH (ref 4.8–5.6)

## 2024-03-05 LAB — SPECIMEN STATUS REPORT

## 2024-03-05 LAB — MICROALBUMIN / CREATININE URINE RATIO
Creatinine, Urine: 330.7 mg/dL
Microalb/Creat Ratio: 27 mg/g{creat} (ref 0–29)
Microalbumin, Urine: 88.3 ug/mL

## 2024-03-05 LAB — VITAMIN D 25 HYDROXY (VIT D DEFICIENCY, FRACTURES): Vit D, 25-Hydroxy: 65 ng/mL (ref 30.0–100.0)

## 2024-03-05 LAB — TSH: TSH: 1.42 u[IU]/mL (ref 0.450–4.500)

## 2024-03-05 LAB — HEPATITIS C ANTIBODY: Hep C Virus Ab: NONREACTIVE

## 2024-03-07 ENCOUNTER — Ambulatory Visit: Payer: Self-pay | Admitting: Family Medicine

## 2024-03-07 LAB — CYTOLOGY - PAP
Chlamydia: NEGATIVE
Comment: NEGATIVE
Comment: NEGATIVE
Comment: NEGATIVE
Comment: NORMAL
Diagnosis: NEGATIVE
High risk HPV: NEGATIVE
Neisseria Gonorrhea: NEGATIVE
Trichomonas: NEGATIVE

## 2024-03-07 NOTE — Progress Notes (Signed)
 HI Anna Patel,  Pap smear came back normal which is great.  Negative for HPV so we can repeat Pap smear in 3 years.  Also negative for gonorrhea chlamydia and trichomonas.

## 2024-03-07 NOTE — Progress Notes (Signed)
 Hi Anna Patel,  Overall total cholesterol and HDL look great.  LDL cholesterol is a little better at 120.  Goal is still under 100 but it is looking better.  A1c looks good at 5.8 still in the prediabetes range but stable.  Hemoglobin looks great at 15.  Blood count including liver and kidney function is stable.  Vitamin D  still looks great.  Thyroid is normal.  Negative for hepatitis C.  We just screen once in your lifetime.  No excess protein in the urine that is concerning which is great.

## 2024-03-11 LAB — HM MAMMOGRAPHY

## 2024-03-12 ENCOUNTER — Encounter: Payer: Self-pay | Admitting: Family Medicine

## 2024-03-27 ENCOUNTER — Other Ambulatory Visit: Payer: Self-pay | Admitting: *Deleted

## 2024-03-27 ENCOUNTER — Other Ambulatory Visit

## 2024-03-27 DIAGNOSIS — E876 Hypokalemia: Secondary | ICD-10-CM

## 2024-03-27 DIAGNOSIS — E010 Iodine-deficiency related diffuse (endemic) goiter: Secondary | ICD-10-CM

## 2024-03-27 MED ORDER — POTASSIUM CHLORIDE CRYS ER 20 MEQ PO TBCR: 20.0000 meq | EXTENDED_RELEASE_TABLET | Freq: Every day | ORAL | 1 refills | Status: AC

## 2024-04-01 NOTE — Progress Notes (Signed)
 Hi Anna Patel, the ultrasound of your thyroid shows no discrete nodules in your thyroid gland.  Which is good.  The thyroid lobes are pretty symmetric.  They did those see some swollen lymph nodes particularly on the left side of your neck..  No concerning features of the lymph nodes at this point but they did recommend that we repeat the ultrasound in 3 months to make sure that they go back down.  Times if you have an infection, a cold or allergies they can swell up or get a little puffy.

## 2024-04-04 ENCOUNTER — Other Ambulatory Visit: Payer: Self-pay | Admitting: Family Medicine

## 2025-03-05 ENCOUNTER — Encounter: Admitting: Family Medicine
